# Patient Record
Sex: Female | Born: 1985 | Race: White | Hispanic: No | Marital: Single | Smoking: Current every day smoker
Health system: Southern US, Community
[De-identification: ages and names within clinical notes are randomized; demographics above are authoritative.]

## PROBLEM LIST (undated history)

## (undated) DIAGNOSIS — Z22322 Carrier or suspected carrier of Methicillin resistant Staphylococcus aureus: Secondary | ICD-10-CM

## (undated) DIAGNOSIS — N83209 Unspecified ovarian cyst, unspecified side: Secondary | ICD-10-CM

## (undated) HISTORY — PX: TUBAL LIGATION: SHX77

## (undated) HISTORY — PX: HAND SURGERY: SHX662

## (undated) HISTORY — PX: KNEE SURGERY: SHX244

## (undated) HISTORY — PX: DILATION AND CURETTAGE OF UTERUS: SHX78

## (undated) HISTORY — PX: CHOLECYSTECTOMY: SHX55

## (undated) HISTORY — PX: FOOT SURGERY: SHX648

---
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
## 2007-05-04 ENCOUNTER — Emergency Department: Payer: Self-pay | Admitting: Emergency Medicine

## 2007-05-04 ENCOUNTER — Other Ambulatory Visit: Payer: Self-pay

## 2007-06-16 ENCOUNTER — Emergency Department: Payer: Self-pay | Admitting: Emergency Medicine

## 2007-08-01 ENCOUNTER — Observation Stay: Payer: Self-pay

## 2007-08-05 ENCOUNTER — Inpatient Hospital Stay: Payer: Self-pay | Admitting: Certified Nurse Midwife

## 2007-09-12 ENCOUNTER — Observation Stay: Payer: Self-pay

## 2007-10-11 ENCOUNTER — Observation Stay: Payer: Self-pay | Admitting: Obstetrics and Gynecology

## 2007-10-19 ENCOUNTER — Observation Stay: Payer: Self-pay

## 2007-10-21 ENCOUNTER — Ambulatory Visit: Payer: Self-pay | Admitting: Certified Nurse Midwife

## 2007-10-25 ENCOUNTER — Observation Stay: Payer: Self-pay

## 2007-10-26 ENCOUNTER — Ambulatory Visit: Payer: Self-pay

## 2007-11-04 ENCOUNTER — Observation Stay: Payer: Self-pay

## 2007-11-06 ENCOUNTER — Observation Stay: Payer: Self-pay | Admitting: Obstetrics and Gynecology

## 2007-11-19 ENCOUNTER — Observation Stay: Payer: Self-pay

## 2007-11-27 ENCOUNTER — Observation Stay: Payer: Self-pay | Admitting: Obstetrics and Gynecology

## 2007-11-28 ENCOUNTER — Observation Stay: Payer: Self-pay | Admitting: Obstetrics and Gynecology

## 2007-12-01 ENCOUNTER — Observation Stay: Payer: Self-pay

## 2007-12-04 ENCOUNTER — Emergency Department: Payer: Self-pay | Admitting: Emergency Medicine

## 2007-12-08 ENCOUNTER — Observation Stay: Payer: Self-pay | Admitting: Obstetrics and Gynecology

## 2007-12-11 ENCOUNTER — Observation Stay: Payer: Self-pay

## 2007-12-13 ENCOUNTER — Inpatient Hospital Stay: Payer: Self-pay

## 2008-01-29 ENCOUNTER — Emergency Department: Payer: Self-pay | Admitting: Emergency Medicine

## 2008-03-17 ENCOUNTER — Ambulatory Visit: Payer: Self-pay | Admitting: Specialist

## 2008-04-13 ENCOUNTER — Ambulatory Visit: Payer: Self-pay | Admitting: Specialist

## 2008-04-27 ENCOUNTER — Ambulatory Visit: Payer: Self-pay | Admitting: Specialist

## 2008-11-10 ENCOUNTER — Ambulatory Visit: Payer: Self-pay | Admitting: Obstetrics and Gynecology

## 2008-11-15 ENCOUNTER — Ambulatory Visit: Payer: Self-pay | Admitting: Obstetrics and Gynecology

## 2008-11-16 ENCOUNTER — Observation Stay: Payer: Self-pay | Admitting: Obstetrics and Gynecology

## 2008-12-08 ENCOUNTER — Emergency Department: Payer: Self-pay | Admitting: Emergency Medicine

## 2008-12-12 ENCOUNTER — Inpatient Hospital Stay: Payer: Self-pay | Admitting: Obstetrics and Gynecology

## 2009-01-08 ENCOUNTER — Encounter: Payer: Self-pay | Admitting: Maternal & Fetal Medicine

## 2009-01-27 ENCOUNTER — Emergency Department: Payer: Self-pay | Admitting: Emergency Medicine

## 2009-02-08 ENCOUNTER — Encounter: Payer: Self-pay | Admitting: Maternal and Fetal Medicine

## 2009-02-22 ENCOUNTER — Encounter: Payer: Self-pay | Admitting: Obstetrics and Gynecology

## 2009-03-04 ENCOUNTER — Observation Stay: Payer: Self-pay | Admitting: Internal Medicine

## 2009-04-19 ENCOUNTER — Observation Stay: Payer: Self-pay | Admitting: Obstetrics and Gynecology

## 2009-05-08 ENCOUNTER — Observation Stay: Payer: Self-pay | Admitting: Obstetrics and Gynecology

## 2009-05-09 ENCOUNTER — Observation Stay: Payer: Self-pay | Admitting: Obstetrics and Gynecology

## 2009-05-19 ENCOUNTER — Observation Stay: Payer: Self-pay | Admitting: Obstetrics and Gynecology

## 2009-05-20 ENCOUNTER — Observation Stay: Payer: Self-pay | Admitting: Obstetrics and Gynecology

## 2009-06-07 ENCOUNTER — Observation Stay: Payer: Self-pay | Admitting: Obstetrics and Gynecology

## 2009-06-08 ENCOUNTER — Observation Stay: Payer: Self-pay

## 2009-06-16 ENCOUNTER — Observation Stay: Payer: Self-pay | Admitting: Obstetrics and Gynecology

## 2009-06-27 ENCOUNTER — Observation Stay: Payer: Self-pay | Admitting: Obstetrics and Gynecology

## 2009-07-05 ENCOUNTER — Observation Stay: Payer: Self-pay | Admitting: Obstetrics and Gynecology

## 2009-07-09 ENCOUNTER — Observation Stay: Payer: Self-pay

## 2009-07-10 ENCOUNTER — Observation Stay: Payer: Self-pay | Admitting: Obstetrics and Gynecology

## 2009-07-11 ENCOUNTER — Inpatient Hospital Stay: Payer: Self-pay

## 2009-10-28 ENCOUNTER — Emergency Department: Payer: Self-pay | Admitting: Emergency Medicine

## 2009-11-27 ENCOUNTER — Inpatient Hospital Stay: Payer: Self-pay | Admitting: Psychiatry

## 2009-12-19 ENCOUNTER — Ambulatory Visit: Payer: Self-pay | Admitting: Family

## 2010-01-01 ENCOUNTER — Ambulatory Visit: Payer: Self-pay | Admitting: Surgery

## 2010-01-03 ENCOUNTER — Inpatient Hospital Stay: Payer: Self-pay | Admitting: Vascular Surgery

## 2010-01-04 ENCOUNTER — Ambulatory Visit: Payer: Self-pay | Admitting: Surgery

## 2010-02-13 ENCOUNTER — Ambulatory Visit: Payer: Self-pay | Admitting: Family Medicine

## 2010-05-08 ENCOUNTER — Emergency Department: Payer: Self-pay | Admitting: Emergency Medicine

## 2010-05-09 ENCOUNTER — Emergency Department: Payer: Self-pay | Admitting: Emergency Medicine

## 2010-05-17 ENCOUNTER — Emergency Department: Payer: Self-pay | Admitting: Emergency Medicine

## 2010-05-24 ENCOUNTER — Ambulatory Visit: Payer: Self-pay | Admitting: Family Medicine

## 2010-06-10 ENCOUNTER — Ambulatory Visit: Payer: Self-pay | Admitting: Gastroenterology

## 2010-06-22 ENCOUNTER — Emergency Department: Payer: Self-pay | Admitting: Emergency Medicine

## 2010-08-14 ENCOUNTER — Ambulatory Visit: Payer: Self-pay | Admitting: Surgery

## 2010-08-21 ENCOUNTER — Emergency Department: Payer: Self-pay | Admitting: Internal Medicine

## 2010-09-16 ENCOUNTER — Ambulatory Visit: Payer: Self-pay | Admitting: Unknown Physician Specialty

## 2010-10-16 ENCOUNTER — Inpatient Hospital Stay: Payer: Self-pay | Admitting: Psychiatry

## 2010-11-10 ENCOUNTER — Emergency Department: Payer: Self-pay | Admitting: Emergency Medicine

## 2010-12-08 ENCOUNTER — Emergency Department: Payer: Self-pay | Admitting: Emergency Medicine

## 2011-04-21 ENCOUNTER — Ambulatory Visit: Payer: Self-pay | Admitting: Family Medicine

## 2011-04-24 ENCOUNTER — Observation Stay: Payer: Self-pay | Admitting: Surgery

## 2011-04-24 LAB — COMPREHENSIVE METABOLIC PANEL
Albumin: 3.9 g/dL (ref 3.4–5.0)
Anion Gap: 8 (ref 7–16)
BUN: 12 mg/dL (ref 7–18)
Bilirubin,Total: 0.3 mg/dL (ref 0.2–1.0)
Chloride: 108 mmol/L — ABNORMAL HIGH (ref 98–107)
Co2: 27 mmol/L (ref 21–32)
EGFR (African American): >60
Glucose: 76 mg/dL (ref 65–99)
Osmolality: 283 (ref 275–301)
Potassium: 4.4 mmol/L (ref 3.5–5.1)
SGOT(AST): 26 U/L (ref 15–37)
SGPT (ALT): 31 U/L
Sodium: 143 mmol/L (ref 136–145)

## 2011-04-24 LAB — URINALYSIS, COMPLETE
Bilirubin,UR: NEGATIVE
Leukocyte Esterase: NEGATIVE
Nitrite: NEGATIVE
Protein: NEGATIVE
RBC,UR: 3 /HPF (ref 0–5)
Specific Gravity: 1.025 (ref 1.003–1.030)
WBC UR: 2 /HPF (ref 0–5)

## 2011-04-24 LAB — LIPASE, BLOOD: Lipase: 112 U/L (ref 73–393)

## 2011-04-24 LAB — CBC
HGB: 15.5 g/dL (ref 12.0–16.0)
MCH: 30.3 pg (ref 26.0–34.0)
MCHC: 33.5 g/dL (ref 32.0–36.0)
MCV: 90 fL (ref 80–100)
WBC: 11.6 10*3/uL — ABNORMAL HIGH (ref 3.6–11.0)

## 2011-04-24 LAB — PREGNANCY, URINE: Pregnancy Test, Urine: NEGATIVE m[IU]/mL

## 2011-04-25 LAB — BASIC METABOLIC PANEL
Anion Gap: 12 (ref 7–16)
BUN: 10 mg/dL (ref 7–18)
Calcium, Total: 8.8 mg/dL (ref 8.5–10.1)
Chloride: 108 mmol/L — ABNORMAL HIGH (ref 98–107)
Co2: 21 mmol/L (ref 21–32)
Creatinine: 0.6 mg/dL (ref 0.60–1.30)
EGFR (African American): >60
EGFR (Non-African Amer.): >60
Glucose: 115 mg/dL — ABNORMAL HIGH (ref 65–99)
Osmolality: 281 (ref 275–301)
Potassium: 4 mmol/L (ref 3.5–5.1)
Sodium: 141 mmol/L (ref 136–145)

## 2011-04-25 LAB — CBC WITH DIFFERENTIAL/PLATELET
Basophil #: 0 10*3/uL (ref 0.0–0.1)
Basophil %: 0.1 %
Eosinophil #: 0 10*3/uL (ref 0.0–0.7)
Eosinophil %: 0 %
HCT: 41.9 % (ref 35.0–47.0)
MCHC: 33.9 g/dL (ref 32.0–36.0)
MCV: 90 fL (ref 80–100)
Monocyte #: 0.1 10*3/uL (ref 0.0–0.7)
Monocyte %: 1.2 %
Platelet: 212 10*3/uL (ref 150–440)
RBC: 4.68 10*6/uL (ref 3.80–5.20)
WBC: 6.7 10*3/uL (ref 3.6–11.0)

## 2011-04-26 LAB — CBC WITH DIFFERENTIAL/PLATELET
Basophil #: 0 10*3/uL (ref 0.0–0.1)
Eosinophil #: 0 10*3/uL (ref 0.0–0.7)
Eosinophil %: 0.5 %
HCT: 36.1 % (ref 35.0–47.0)
HGB: 12.2 g/dL (ref 12.0–16.0)
Lymphocyte #: 4 10*3/uL — ABNORMAL HIGH (ref 1.0–3.6)
Lymphocyte %: 49.8 %
Monocyte #: 0.5 10*3/uL (ref 0.0–0.7)
Monocyte %: 5.7 %
Neutrophil %: 43.6 %
Platelet: 191 10*3/uL (ref 150–440)
RBC: 3.97 10*6/uL (ref 3.80–5.20)
WBC: 7.9 10*3/uL (ref 3.6–11.0)

## 2011-06-16 ENCOUNTER — Emergency Department: Payer: Self-pay | Admitting: *Deleted

## 2011-06-16 LAB — URINALYSIS, COMPLETE
Glucose,UR: NEGATIVE mg/dL (ref 0–75)
Leukocyte Esterase: NEGATIVE
Nitrite: NEGATIVE
Ph: 5 (ref 4.5–8.0)
Protein: NEGATIVE
RBC,UR: 159 /HPF (ref 0–5)
Specific Gravity: 1.018 (ref 1.003–1.030)
Squamous Epithelial: <1

## 2011-06-16 LAB — COMPREHENSIVE METABOLIC PANEL
Anion Gap: 7 (ref 7–16)
Calcium, Total: 8.6 mg/dL (ref 8.5–10.1)
Chloride: 109 mmol/L — ABNORMAL HIGH (ref 98–107)
Creatinine: 0.69 mg/dL (ref 0.60–1.30)
EGFR (African American): >60
Glucose: 87 mg/dL (ref 65–99)
Osmolality: 279 (ref 275–301)
SGOT(AST): 17 U/L (ref 15–37)
SGPT (ALT): 21 U/L
Sodium: 141 mmol/L (ref 136–145)
Total Protein: 6.6 g/dL (ref 6.4–8.2)

## 2011-06-16 LAB — CBC
HGB: 13.9 g/dL (ref 12.0–16.0)
MCH: 31 pg (ref 26.0–34.0)
MCHC: 33.8 g/dL (ref 32.0–36.0)
MCV: 92 fL (ref 80–100)
RBC: 4.48 10*6/uL (ref 3.80–5.20)
WBC: 9.9 10*3/uL (ref 3.6–11.0)

## 2011-06-16 LAB — LIPASE, BLOOD: Lipase: 144 U/L (ref 73–393)

## 2011-12-11 ENCOUNTER — Emergency Department: Payer: Self-pay | Admitting: Emergency Medicine

## 2011-12-11 LAB — COMPREHENSIVE METABOLIC PANEL
Anion Gap: 4 — ABNORMAL LOW (ref 7–16)
BUN: 15 mg/dL (ref 7–18)
Bilirubin,Total: 0.4 mg/dL (ref 0.2–1.0)
Calcium, Total: 9.8 mg/dL (ref 8.5–10.1)
Chloride: 108 mmol/L — ABNORMAL HIGH (ref 98–107)
Co2: 28 mmol/L (ref 21–32)
EGFR (African American): >60
EGFR (Non-African Amer.): >60
Glucose: 84 mg/dL (ref 65–99)
Potassium: 4 mmol/L (ref 3.5–5.1)
SGOT(AST): 23 U/L (ref 15–37)
SGPT (ALT): 48 U/L (ref 12–78)
Sodium: 140 mmol/L (ref 136–145)
Total Protein: 7.9 g/dL (ref 6.4–8.2)

## 2011-12-11 LAB — CBC
HGB: 15.5 g/dL (ref 12.0–16.0)
Platelet: 237 10*3/uL (ref 150–440)
RBC: 5.02 10*6/uL (ref 3.80–5.20)
RDW: 13.2 % (ref 11.5–14.5)
WBC: 12.1 10*3/uL — ABNORMAL HIGH (ref 3.6–11.0)

## 2011-12-11 LAB — PREGNANCY, URINE: Pregnancy Test, Urine: NEGATIVE m[IU]/mL

## 2011-12-11 LAB — URINALYSIS, COMPLETE
Bilirubin,UR: NEGATIVE
Glucose,UR: NEGATIVE mg/dL (ref 0–75)
Ketone: NEGATIVE
Protein: 30
RBC,UR: 17 /HPF (ref 0–5)
Specific Gravity: 1.019 (ref 1.003–1.030)
Squamous Epithelial: 3

## 2011-12-13 LAB — URINE CULTURE

## 2012-01-23 ENCOUNTER — Emergency Department: Payer: Self-pay | Admitting: Emergency Medicine

## 2012-01-23 LAB — RAPID INFLUENZA A&B ANTIGENS

## 2012-01-24 ENCOUNTER — Emergency Department: Payer: Self-pay | Admitting: Emergency Medicine

## 2012-01-24 LAB — URINALYSIS, COMPLETE
Bilirubin,UR: NEGATIVE
Blood: NEGATIVE
Nitrite: NEGATIVE
Ph: 6 (ref 4.5–8.0)
Squamous Epithelial: 7

## 2012-01-24 LAB — COMPREHENSIVE METABOLIC PANEL
Albumin: 4.1 g/dL (ref 3.4–5.0)
Alkaline Phosphatase: 96 U/L (ref 50–136)
Anion Gap: 8 (ref 7–16)
BUN: 14 mg/dL (ref 7–18)
Calcium, Total: 9.8 mg/dL (ref 8.5–10.1)
Chloride: 107 mmol/L (ref 98–107)
Creatinine: 0.72 mg/dL (ref 0.60–1.30)
EGFR (African American): >60
EGFR (Non-African Amer.): >60
Glucose: 91 mg/dL (ref 65–99)
Potassium: 3.9 mmol/L (ref 3.5–5.1)
Sodium: 139 mmol/L (ref 136–145)
Total Protein: 8.1 g/dL (ref 6.4–8.2)

## 2012-01-24 LAB — CBC
MCH: 30.8 pg (ref 26.0–34.0)
MCHC: 34.4 g/dL (ref 32.0–36.0)
Platelet: 266 10*3/uL (ref 150–440)

## 2012-01-24 LAB — LIPASE, BLOOD: Lipase: 133 U/L (ref 73–393)

## 2012-01-26 ENCOUNTER — Emergency Department: Payer: Self-pay | Admitting: Emergency Medicine

## 2012-01-26 LAB — CBC WITH DIFFERENTIAL/PLATELET
Basophil #: 0 10*3/uL (ref 0.0–0.1)
Eosinophil #: 0.2 10*3/uL (ref 0.0–0.7)
HCT: 46.3 % (ref 35.0–47.0)
HGB: 15.6 g/dL (ref 12.0–16.0)
Lymphocyte %: 21 %
MCH: 30.6 pg (ref 26.0–34.0)
MCHC: 33.8 g/dL (ref 32.0–36.0)
Neutrophil #: 6.7 10*3/uL — ABNORMAL HIGH (ref 1.4–6.5)
Platelet: 250 10*3/uL (ref 150–440)
RBC: 5.11 10*6/uL (ref 3.80–5.20)
RDW: 13.6 % (ref 11.5–14.5)
WBC: 9.6 10*3/uL (ref 3.6–11.0)

## 2012-01-26 LAB — URINALYSIS, COMPLETE
Bilirubin,UR: NEGATIVE
Ketone: NEGATIVE
Nitrite: NEGATIVE
Ph: 6 (ref 4.5–8.0)
RBC,UR: 1 /HPF (ref 0–5)
Specific Gravity: 1.02 (ref 1.003–1.030)
Squamous Epithelial: 10

## 2012-01-26 LAB — COMPREHENSIVE METABOLIC PANEL
Albumin: 4.4 g/dL (ref 3.4–5.0)
Anion Gap: 8 (ref 7–16)
Calcium, Total: 9.4 mg/dL (ref 8.5–10.1)
Chloride: 109 mmol/L — ABNORMAL HIGH (ref 98–107)
EGFR (Non-African Amer.): >60
Glucose: 86 mg/dL (ref 65–99)
Osmolality: 279 (ref 275–301)
Potassium: 4.1 mmol/L (ref 3.5–5.1)
Sodium: 140 mmol/L (ref 136–145)
Total Protein: 8.1 g/dL (ref 6.4–8.2)

## 2012-01-26 LAB — HCG, QUANTITATIVE, PREGNANCY: Beta Hcg, Quant.: <1 m[IU]/mL — ABNORMAL LOW

## 2012-03-29 ENCOUNTER — Emergency Department: Payer: Self-pay | Admitting: Emergency Medicine

## 2012-03-29 LAB — COMPREHENSIVE METABOLIC PANEL
Albumin: 4 g/dL (ref 3.4–5.0)
Alkaline Phosphatase: 87 U/L (ref 50–136)
Anion Gap: 6 — ABNORMAL LOW (ref 7–16)
BUN: 11 mg/dL (ref 7–18)
Bilirubin,Total: 0.3 mg/dL (ref 0.2–1.0)
Calcium, Total: 9.5 mg/dL (ref 8.5–10.1)
Chloride: 105 mmol/L (ref 98–107)
EGFR (African American): >60
EGFR (Non-African Amer.): >60
Osmolality: 271 (ref 275–301)
Potassium: 3.8 mmol/L (ref 3.5–5.1)
SGOT(AST): 18 U/L (ref 15–37)
SGPT (ALT): 24 U/L (ref 12–78)
Total Protein: 8.4 g/dL — ABNORMAL HIGH (ref 6.4–8.2)

## 2012-03-29 LAB — URINALYSIS, COMPLETE
Bilirubin,UR: NEGATIVE
Blood: NEGATIVE
Glucose,UR: NEGATIVE mg/dL (ref 0–75)
Ketone: NEGATIVE
Nitrite: NEGATIVE
Ph: 5 (ref 4.5–8.0)
RBC,UR: <1 /HPF (ref 0–5)
Specific Gravity: 1.018 (ref 1.003–1.030)
WBC UR: <1 /HPF (ref 0–5)

## 2012-03-29 LAB — HCG, QUANTITATIVE, PREGNANCY: Beta Hcg, Quant.: 3587 m[IU]/mL — ABNORMAL HIGH

## 2012-03-29 LAB — CBC
HGB: 15.1 g/dL (ref 12.0–16.0)
MCHC: 33.2 g/dL (ref 32.0–36.0)
MCV: 90 fL (ref 80–100)
RDW: 13.5 % (ref 11.5–14.5)

## 2012-03-29 LAB — WET PREP, GENITAL

## 2012-04-11 ENCOUNTER — Emergency Department: Payer: Self-pay | Admitting: Unknown Physician Specialty

## 2012-04-11 LAB — HEPATIC FUNCTION PANEL A (ARMC)
Albumin: 3.9 g/dL (ref 3.4–5.0)
Alkaline Phosphatase: 86 U/L (ref 50–136)
Bilirubin, Direct: 0.1 mg/dL (ref 0.00–0.20)
Bilirubin,Total: 0.5 mg/dL (ref 0.2–1.0)

## 2012-04-11 LAB — URINALYSIS, COMPLETE
Bacteria: NONE SEEN
Blood: NEGATIVE
Ketone: NEGATIVE
Leukocyte Esterase: NEGATIVE
Nitrite: NEGATIVE
Ph: 5 (ref 4.5–8.0)
Protein: 30
RBC,UR: 1 /HPF (ref 0–5)
Squamous Epithelial: 29

## 2012-04-11 LAB — LIPASE, BLOOD: Lipase: 109 U/L (ref 73–393)

## 2012-04-11 LAB — MAGNESIUM: Magnesium: 1.6 mg/dL — ABNORMAL LOW

## 2012-04-11 LAB — CBC
HCT: 44 % (ref 35.0–47.0)
HGB: 15.4 g/dL (ref 12.0–16.0)
Platelet: 202 10*3/uL (ref 150–440)
RDW: 13.1 % (ref 11.5–14.5)

## 2012-04-11 LAB — BASIC METABOLIC PANEL
Calcium, Total: 8.7 mg/dL (ref 8.5–10.1)
Chloride: 105 mmol/L (ref 98–107)
Co2: 19 mmol/L — ABNORMAL LOW (ref 21–32)
EGFR (African American): >60
EGFR (Non-African Amer.): >60
Glucose: 111 mg/dL — ABNORMAL HIGH (ref 65–99)
Osmolality: 273 (ref 275–301)
Potassium: 3.4 mmol/L — ABNORMAL LOW (ref 3.5–5.1)

## 2012-04-11 LAB — HCG, QUANTITATIVE, PREGNANCY: Beta Hcg, Quant.: 43486 m[IU]/mL — ABNORMAL HIGH

## 2012-06-05 ENCOUNTER — Emergency Department: Payer: Self-pay | Admitting: Emergency Medicine

## 2012-06-05 LAB — URINALYSIS, COMPLETE
Bacteria: NONE SEEN
Glucose,UR: NEGATIVE mg/dL (ref 0–75)
Nitrite: NEGATIVE
Ph: 6 (ref 4.5–8.0)
Specific Gravity: 1.03 (ref 1.003–1.030)
WBC UR: 11 /HPF (ref 0–5)

## 2012-06-05 LAB — CBC
MCH: 29.4 pg (ref 26.0–34.0)
MCHC: 34.4 g/dL (ref 32.0–36.0)
MCV: 86 fL (ref 80–100)
Platelet: 194 10*3/uL (ref 150–440)
RBC: 4.18 10*6/uL (ref 3.80–5.20)
RDW: 13.5 % (ref 11.5–14.5)

## 2012-06-05 LAB — COMPREHENSIVE METABOLIC PANEL
Alkaline Phosphatase: 79 U/L (ref 50–136)
Anion Gap: 7 (ref 7–16)
Bilirubin,Total: 0.1 mg/dL — ABNORMAL LOW (ref 0.2–1.0)
Creatinine: 0.46 mg/dL — ABNORMAL LOW (ref 0.60–1.30)
EGFR (Non-African Amer.): >60
Potassium: 3.4 mmol/L — ABNORMAL LOW (ref 3.5–5.1)
SGOT(AST): 14 U/L — ABNORMAL LOW (ref 15–37)
SGPT (ALT): 20 U/L (ref 12–78)

## 2012-06-05 LAB — WET PREP, GENITAL

## 2012-06-05 LAB — GC/CHLAMYDIA PROBE AMP

## 2012-06-10 ENCOUNTER — Ambulatory Visit: Payer: Self-pay | Admitting: Obstetrics & Gynecology

## 2012-07-29 ENCOUNTER — Observation Stay: Payer: Self-pay | Admitting: Obstetrics & Gynecology

## 2012-07-29 LAB — URINALYSIS, COMPLETE
Bilirubin,UR: NEGATIVE
Leukocyte Esterase: NEGATIVE
Nitrite: NEGATIVE
Ph: 6 (ref 4.5–8.0)
Protein: NEGATIVE
RBC,UR: 20 /HPF (ref 0–5)
Squamous Epithelial: 12
WBC UR: 4 /HPF (ref 0–5)

## 2012-08-14 ENCOUNTER — Observation Stay: Payer: Self-pay | Admitting: Obstetrics and Gynecology

## 2012-08-14 LAB — URINALYSIS, COMPLETE
Glucose,UR: NEGATIVE mg/dL (ref 0–75)
Nitrite: NEGATIVE
Ph: 6 (ref 4.5–8.0)
RBC,UR: 8 /HPF (ref 0–5)
Specific Gravity: 1.013 (ref 1.003–1.030)
Squamous Epithelial: 6
WBC UR: 3 /HPF (ref 0–5)

## 2012-08-16 LAB — URINE CULTURE

## 2012-09-06 ENCOUNTER — Observation Stay: Payer: Self-pay | Admitting: Obstetrics & Gynecology

## 2012-09-09 ENCOUNTER — Observation Stay: Payer: Self-pay | Admitting: Obstetrics and Gynecology

## 2012-09-09 ENCOUNTER — Ambulatory Visit (HOSPITAL_COMMUNITY)
Admission: AD | Admit: 2012-09-09 | Discharge: 2012-09-09 | Disposition: A | Discharge status: Home / Self Care | Payer: Medicaid Other | Source: Other Acute Inpatient Hospital | Attending: Obstetrics and Gynecology | Admitting: Obstetrics and Gynecology

## 2012-09-09 DIAGNOSIS — Z331 Pregnant state, incidental: Secondary | ICD-10-CM | POA: Insufficient documentation

## 2012-09-09 DIAGNOSIS — O479 False labor, unspecified: Secondary | ICD-10-CM | POA: Insufficient documentation

## 2012-09-09 LAB — URINALYSIS, COMPLETE
Bacteria: NONE SEEN
Bilirubin,UR: NEGATIVE
Blood: NEGATIVE
Protein: 30
Specific Gravity: 1.016 (ref 1.003–1.030)
Squamous Epithelial: 1
WBC UR: 32 /HPF (ref 0–5)

## 2012-09-09 LAB — CBC WITH DIFFERENTIAL/PLATELET
Basophil #: 0 10*3/uL (ref 0.0–0.1)
Eosinophil #: 0 10*3/uL (ref 0.0–0.7)
Eosinophil %: 0 %
HCT: 37.1 % (ref 35.0–47.0)
HGB: 12.8 g/dL (ref 12.0–16.0)
Lymphocyte #: 1.2 10*3/uL (ref 1.0–3.6)
MCH: 30.2 pg (ref 26.0–34.0)
MCV: 88 fL (ref 80–100)
Monocyte #: 0.6 x10 3/mm (ref 0.2–0.9)
Neutrophil #: 22 10*3/uL — ABNORMAL HIGH (ref 1.4–6.5)
Neutrophil %: 92.6 %
RBC: 4.23 10*6/uL (ref 3.80–5.20)
WBC: 23.8 10*3/uL — ABNORMAL HIGH (ref 3.6–11.0)

## 2012-09-17 DIAGNOSIS — Z9889 Other specified postprocedural states: Secondary | ICD-10-CM | POA: Insufficient documentation

## 2012-09-17 DIAGNOSIS — Z98891 History of uterine scar from previous surgery: Secondary | ICD-10-CM | POA: Insufficient documentation

## 2012-11-24 ENCOUNTER — Ambulatory Visit: Payer: Self-pay | Admitting: Obstetrics and Gynecology

## 2012-11-30 ENCOUNTER — Ambulatory Visit: Payer: Self-pay | Admitting: Obstetrics and Gynecology

## 2013-07-21 ENCOUNTER — Emergency Department: Payer: Self-pay | Admitting: Emergency Medicine

## 2013-07-21 LAB — CBC WITH DIFFERENTIAL/PLATELET
BASOS PCT: 0.4 %
Basophil #: 0 10*3/uL (ref 0.0–0.1)
EOS ABS: 0.2 10*3/uL (ref 0.0–0.7)
EOS PCT: 1.6 %
HCT: 41.1 % (ref 35.0–47.0)
HGB: 13.8 g/dL (ref 12.0–16.0)
LYMPHS ABS: 3.4 10*3/uL (ref 1.0–3.6)
LYMPHS PCT: 33.2 %
MCH: 29.4 pg (ref 26.0–34.0)
MCHC: 33.5 g/dL (ref 32.0–36.0)
MCV: 88 fL (ref 80–100)
MONO ABS: 0.6 x10 3/mm (ref 0.2–0.9)
MONOS PCT: 6 %
NEUTROS ABS: 6 10*3/uL (ref 1.4–6.5)
Neutrophil %: 58.8 %
Platelet: 293 10*3/uL (ref 150–440)
RBC: 4.69 10*6/uL (ref 3.80–5.20)
RDW: 13.2 % (ref 11.5–14.5)
WBC: 10.1 10*3/uL (ref 3.6–11.0)

## 2013-07-21 LAB — COMPREHENSIVE METABOLIC PANEL
AST: 34 U/L (ref 15–37)
Albumin: 3.7 g/dL (ref 3.4–5.0)
Alkaline Phosphatase: 100 U/L
Anion Gap: 9 (ref 7–16)
BUN: 9 mg/dL (ref 7–18)
Bilirubin,Total: 0.3 mg/dL (ref 0.2–1.0)
CHLORIDE: 105 mmol/L (ref 98–107)
CO2: 24 mmol/L (ref 21–32)
CREATININE: 0.61 mg/dL (ref 0.60–1.30)
Calcium, Total: 9.4 mg/dL (ref 8.5–10.1)
EGFR (African American): >60
EGFR (Non-African Amer.): >60
GLUCOSE: 86 mg/dL (ref 65–99)
Osmolality: 274 (ref 275–301)
Potassium: 3.6 mmol/L (ref 3.5–5.1)
SGPT (ALT): 57 U/L (ref 12–78)
Sodium: 138 mmol/L (ref 136–145)
TOTAL PROTEIN: 7.2 g/dL (ref 6.4–8.2)

## 2013-07-21 LAB — URINALYSIS, COMPLETE
BILIRUBIN, UR: NEGATIVE
BLOOD: NEGATIVE
GLUCOSE, UR: NEGATIVE mg/dL (ref 0–75)
KETONE: NEGATIVE
Nitrite: NEGATIVE
Ph: 5 (ref 4.5–8.0)
Protein: NEGATIVE
Specific Gravity: 1.023 (ref 1.003–1.030)
WBC UR: 9 /HPF (ref 0–5)

## 2013-07-21 LAB — LIPASE, BLOOD: LIPASE: 125 U/L (ref 73–393)

## 2013-08-05 ENCOUNTER — Emergency Department: Payer: Self-pay | Admitting: Emergency Medicine

## 2013-08-05 LAB — CBC
HCT: 41 % (ref 35.0–47.0)
HGB: 13.5 g/dL (ref 12.0–16.0)
MCH: 28.7 pg (ref 26.0–34.0)
MCHC: 32.9 g/dL (ref 32.0–36.0)
MCV: 87 fL (ref 80–100)
Platelet: 251 10*3/uL (ref 150–440)
RBC: 4.7 10*6/uL (ref 3.80–5.20)
RDW: 13.5 % (ref 11.5–14.5)
WBC: 9.9 10*3/uL (ref 3.6–11.0)

## 2013-08-05 LAB — WET PREP, GENITAL

## 2013-09-21 ENCOUNTER — Emergency Department: Payer: Self-pay | Admitting: Student

## 2013-09-21 LAB — URINALYSIS, COMPLETE
BILIRUBIN, UR: NEGATIVE
BLOOD: NEGATIVE
Bacteria: NONE SEEN
GLUCOSE, UR: NEGATIVE mg/dL (ref 0–75)
KETONE: NEGATIVE
LEUKOCYTE ESTERASE: NEGATIVE
NITRITE: NEGATIVE
Ph: 5 (ref 4.5–8.0)
Protein: NEGATIVE
RBC,UR: 1 /HPF (ref 0–5)
Specific Gravity: 1.02 (ref 1.003–1.030)

## 2013-09-21 LAB — COMPREHENSIVE METABOLIC PANEL
ALBUMIN: 3.6 g/dL (ref 3.4–5.0)
ALK PHOS: 103 U/L
ALT: 22 U/L
Anion Gap: 9 (ref 7–16)
BUN: 11 mg/dL (ref 7–18)
Bilirubin,Total: 0.2 mg/dL (ref 0.2–1.0)
CHLORIDE: 108 mmol/L — AB (ref 98–107)
CREATININE: 0.87 mg/dL (ref 0.60–1.30)
Calcium, Total: 8.8 mg/dL (ref 8.5–10.1)
Co2: 23 mmol/L (ref 21–32)
EGFR (African American): >60
EGFR (Non-African Amer.): >60
Glucose: 91 mg/dL (ref 65–99)
OSMOLALITY: 278 (ref 275–301)
POTASSIUM: 4 mmol/L (ref 3.5–5.1)
SGOT(AST): 16 U/L (ref 15–37)
Sodium: 140 mmol/L (ref 136–145)
Total Protein: 7.4 g/dL (ref 6.4–8.2)

## 2013-09-21 LAB — CBC WITH DIFFERENTIAL/PLATELET
BASOS PCT: 0.6 %
Basophil #: 0.1 10*3/uL (ref 0.0–0.1)
Eosinophil #: 0.1 10*3/uL (ref 0.0–0.7)
Eosinophil %: 1.4 %
HCT: 42.5 % (ref 35.0–47.0)
HGB: 13.8 g/dL (ref 12.0–16.0)
Lymphocyte #: 2.8 10*3/uL (ref 1.0–3.6)
Lymphocyte %: 30 %
MCH: 28.3 pg (ref 26.0–34.0)
MCHC: 32.5 g/dL (ref 32.0–36.0)
MCV: 87 fL (ref 80–100)
Monocyte #: 0.6 x10 3/mm (ref 0.2–0.9)
Monocyte %: 6.2 %
NEUTROS ABS: 5.8 10*3/uL (ref 1.4–6.5)
Neutrophil %: 61.8 %
PLATELETS: 283 10*3/uL (ref 150–440)
RBC: 4.87 10*6/uL (ref 3.80–5.20)
RDW: 13.9 % (ref 11.5–14.5)
WBC: 9.4 10*3/uL (ref 3.6–11.0)

## 2013-09-21 LAB — GC/CHLAMYDIA PROBE AMP

## 2013-09-21 LAB — WET PREP, GENITAL

## 2013-09-21 LAB — HCG, QUANTITATIVE, PREGNANCY

## 2013-09-21 LAB — LIPASE, BLOOD: Lipase: 144 U/L (ref 73–393)

## 2013-12-03 ENCOUNTER — Emergency Department: Payer: Self-pay | Admitting: Emergency Medicine

## 2013-12-03 LAB — COMPREHENSIVE METABOLIC PANEL
ALK PHOS: 105 U/L
AST: 25 U/L (ref 15–37)
Albumin: 3.8 g/dL (ref 3.4–5.0)
Anion Gap: 6 — ABNORMAL LOW (ref 7–16)
BUN: 10 mg/dL (ref 7–18)
Bilirubin,Total: 0.2 mg/dL (ref 0.2–1.0)
CALCIUM: 9.6 mg/dL (ref 8.5–10.1)
CHLORIDE: 108 mmol/L — AB (ref 98–107)
CREATININE: 0.79 mg/dL (ref 0.60–1.30)
Co2: 28 mmol/L (ref 21–32)
EGFR (African American): >60
Glucose: 83 mg/dL (ref 65–99)
Osmolality: 281 (ref 275–301)
POTASSIUM: 3.6 mmol/L (ref 3.5–5.1)
SGPT (ALT): 36 U/L
SODIUM: 142 mmol/L (ref 136–145)
Total Protein: 7.6 g/dL (ref 6.4–8.2)

## 2013-12-03 LAB — CBC WITH DIFFERENTIAL/PLATELET
Basophil #: 0.1 10*3/uL (ref 0.0–0.1)
Basophil %: 0.9 %
Eosinophil #: 0.2 10*3/uL (ref 0.0–0.7)
Eosinophil %: 1.6 %
HCT: 42.3 % (ref 35.0–47.0)
HGB: 14.1 g/dL (ref 12.0–16.0)
Lymphocyte #: 3.4 10*3/uL (ref 1.0–3.6)
Lymphocyte %: 33.2 %
MCH: 28.7 pg (ref 26.0–34.0)
MCHC: 33.3 g/dL (ref 32.0–36.0)
MCV: 86 fL (ref 80–100)
MONO ABS: 0.7 x10 3/mm (ref 0.2–0.9)
MONOS PCT: 6.7 %
Neutrophil #: 5.9 10*3/uL (ref 1.4–6.5)
Neutrophil %: 57.6 %
Platelet: 271 10*3/uL (ref 150–440)
RBC: 4.89 10*6/uL (ref 3.80–5.20)
RDW: 14.1 % (ref 11.5–14.5)
WBC: 10.3 10*3/uL (ref 3.6–11.0)

## 2013-12-03 LAB — URINALYSIS, COMPLETE
BLOOD: NEGATIVE
Bacteria: NONE SEEN
Bilirubin,UR: NEGATIVE
Glucose,UR: NEGATIVE mg/dL (ref 0–75)
Ketone: NEGATIVE
Nitrite: NEGATIVE
PH: 5 (ref 4.5–8.0)
Protein: NEGATIVE
RBC,UR: 5 /HPF (ref 0–5)
SPECIFIC GRAVITY: 1.018 (ref 1.003–1.030)
Squamous Epithelial: 11
WBC UR: 6 /HPF (ref 0–5)

## 2013-12-03 LAB — PREGNANCY, URINE: Pregnancy Test, Urine: NEGATIVE m[IU]/mL

## 2013-12-03 LAB — WET PREP, GENITAL

## 2013-12-03 LAB — GC/CHLAMYDIA PROBE AMP

## 2013-12-03 LAB — LIPASE, BLOOD: Lipase: 111 U/L (ref 73–393)

## 2013-12-27 IMAGING — CT CT ABD-PELV W/ CM
1 of 2 series · 15 of 32 positions shown, 19 images · IV contrast (isovue)
Comparison: None

REASON FOR EXAM: (1) abd pain - RLQ -; (2) abd pain - RLQ -
COMMENTS:

PROCEDURE:     CT  - CT ABDOMEN / PELVIS  W  - April 24, 2011  [DATE]
RESULT:     History: Right lower quadrant pain
TECHNIQUE: Multiple axial images of the abdomen and pelvis were performed
from the lung bases to the pubic symphysis, with p.o. contrast and with 100
ml of Isovue 300 intravenous contrast.

[Series 2: soft tissue · axial · 0.78mm/px · z∈[-551,-83]mm · 15 of 172 slices shown, 19 images]
[im 8/172  soft-tissue]
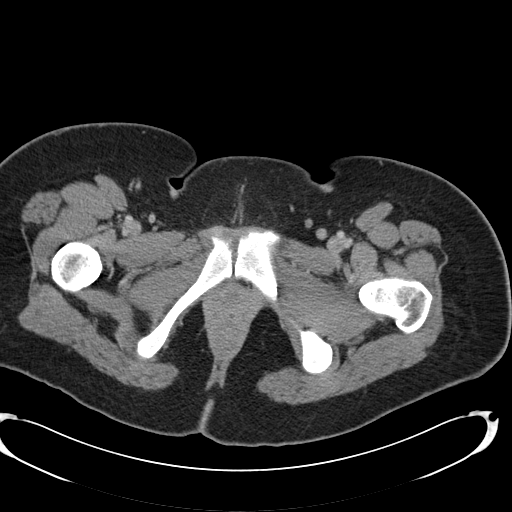
[im 8/172  bone]
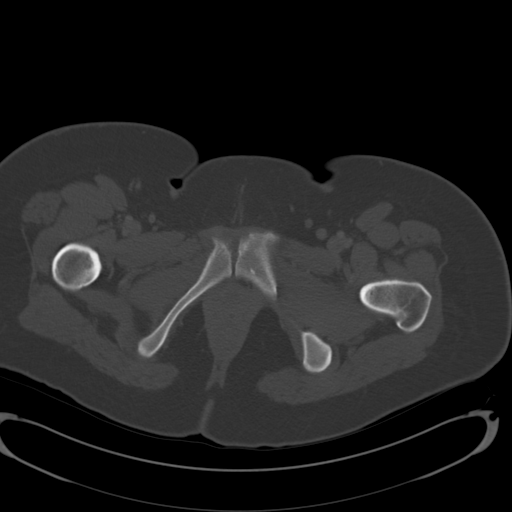
[im 22/172  soft-tissue]
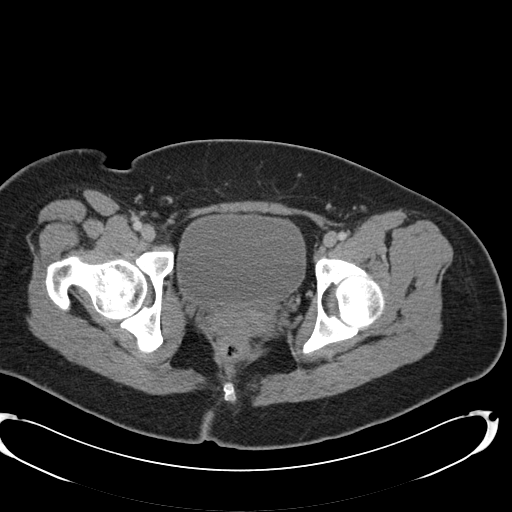
[im 36/172  soft-tissue]
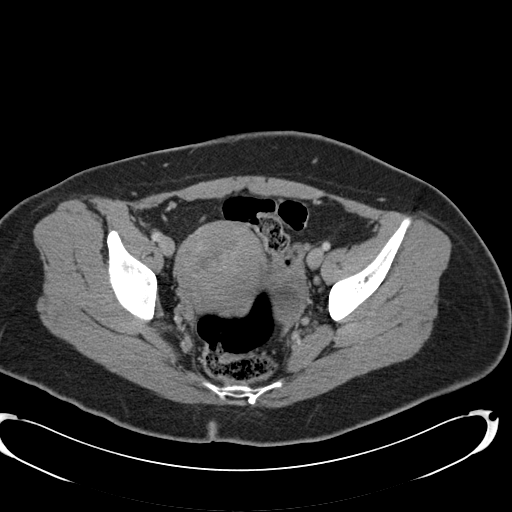
[im 50/172  soft-tissue]
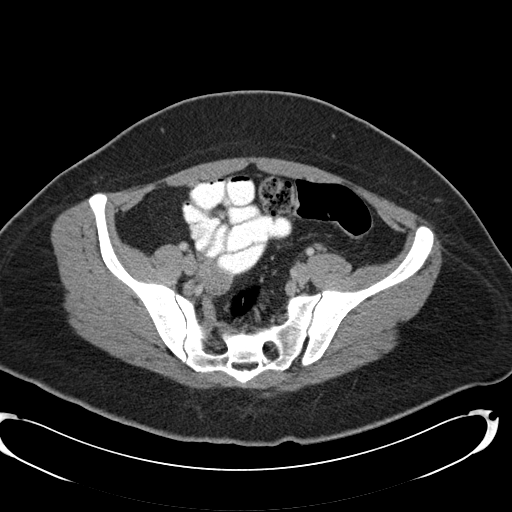
[im 58/172  soft-tissue]
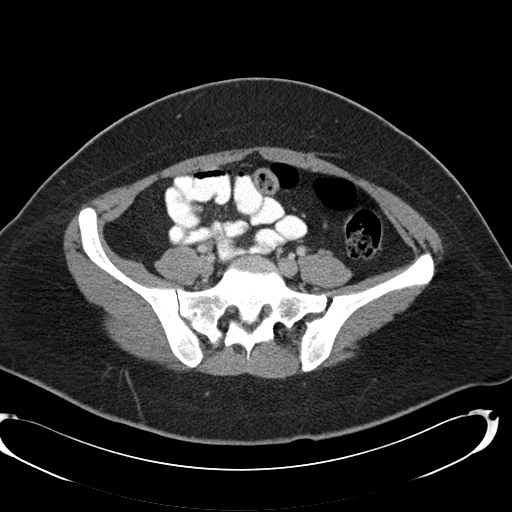
[im 72/172  soft-tissue]
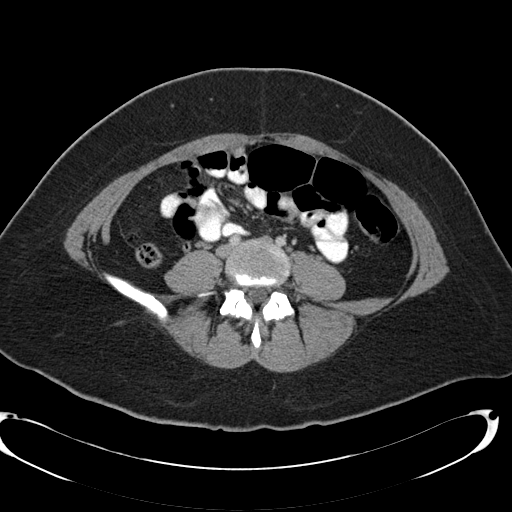
[im 86/172  soft-tissue]
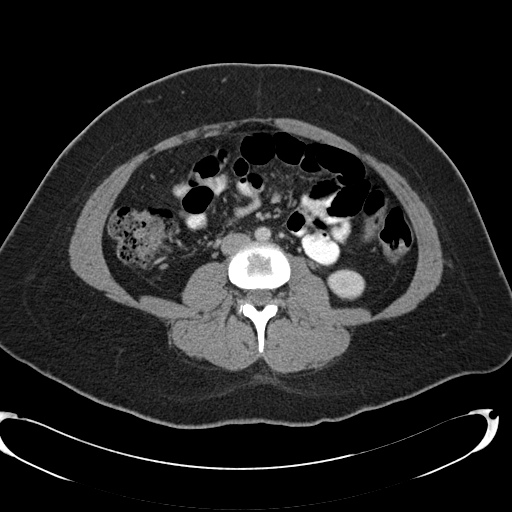
[im 100/172  soft-tissue]
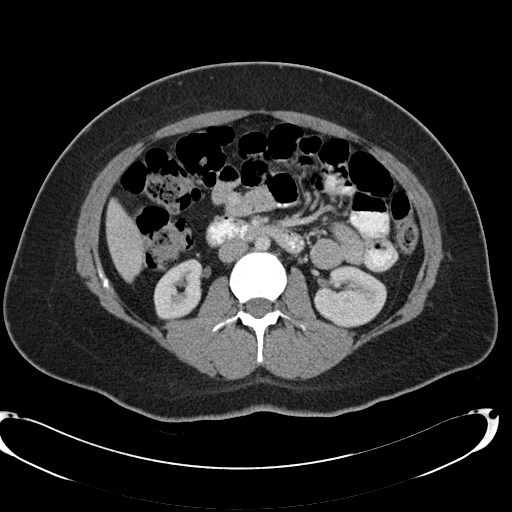
[im 115/172  soft-tissue]
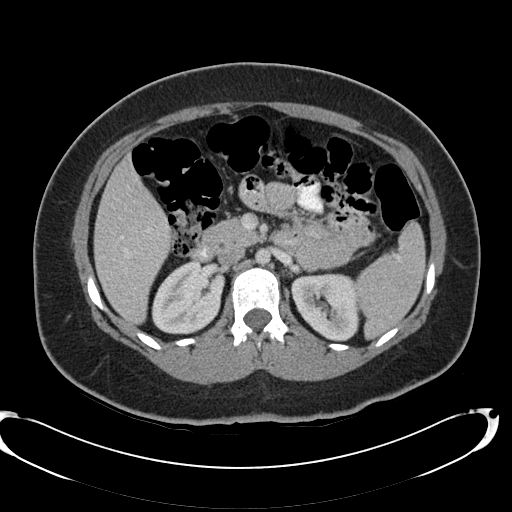
[im 115/172  bone]
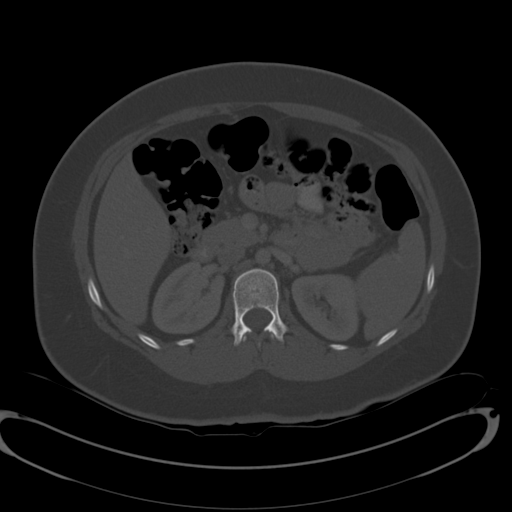
[im 122/172  soft-tissue]
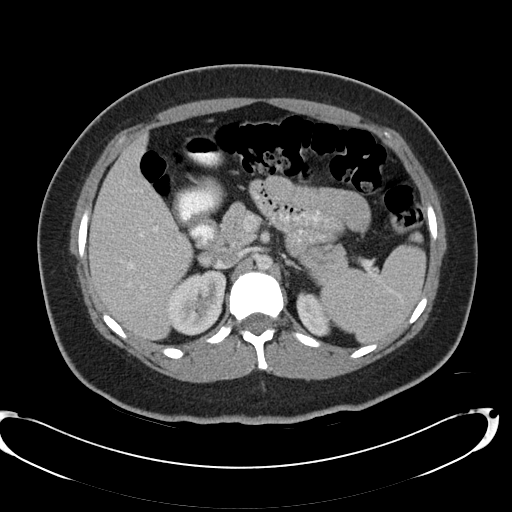
[im 136/172  soft-tissue]
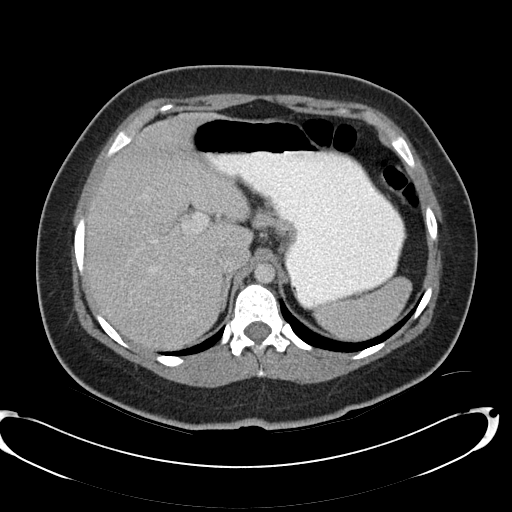
[im 143/172  lung]
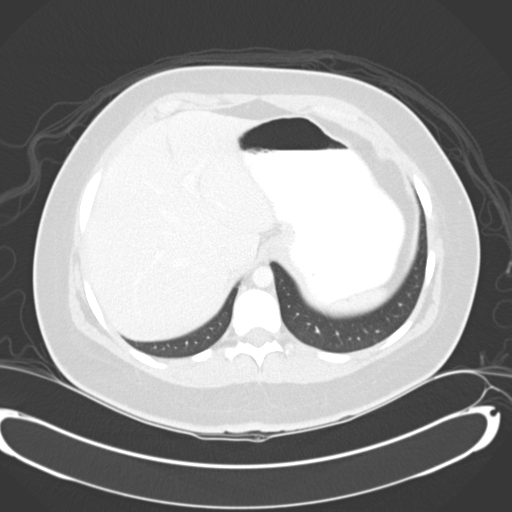
[im 150/172  soft-tissue]
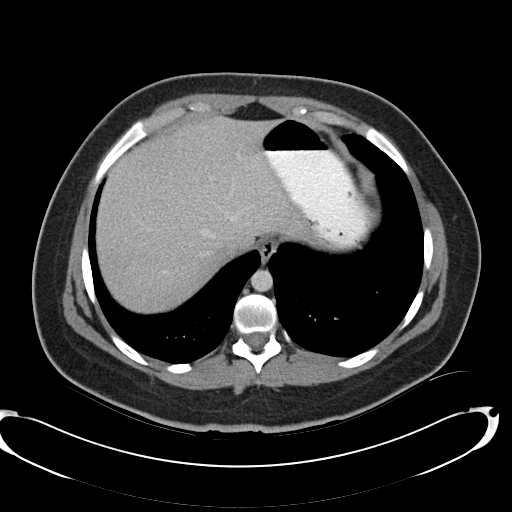
[im 150/172  lung]
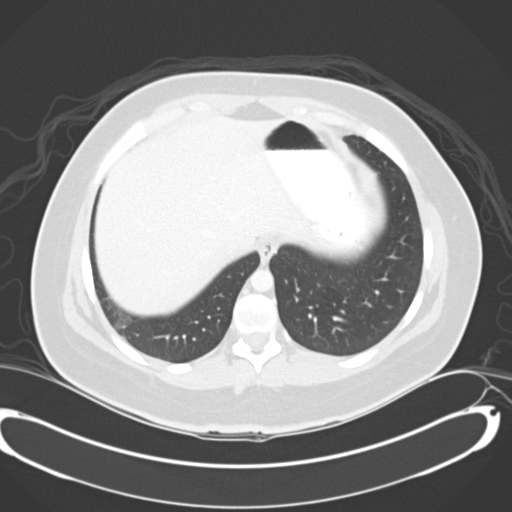
[im 157/172  lung]
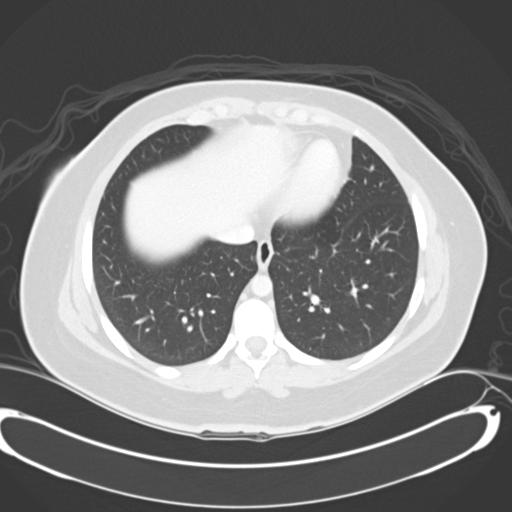
[im 164/172  soft-tissue]
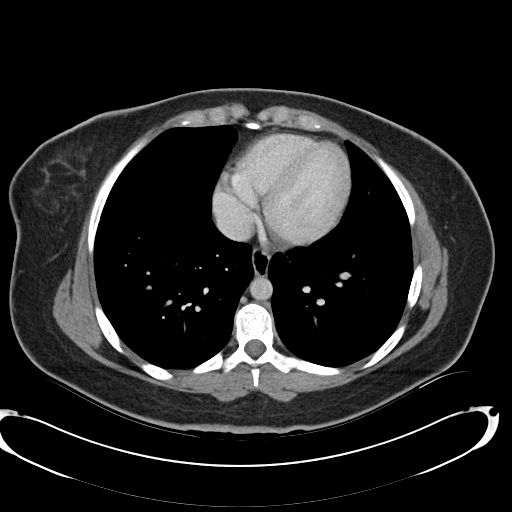
[im 164/172  lung]
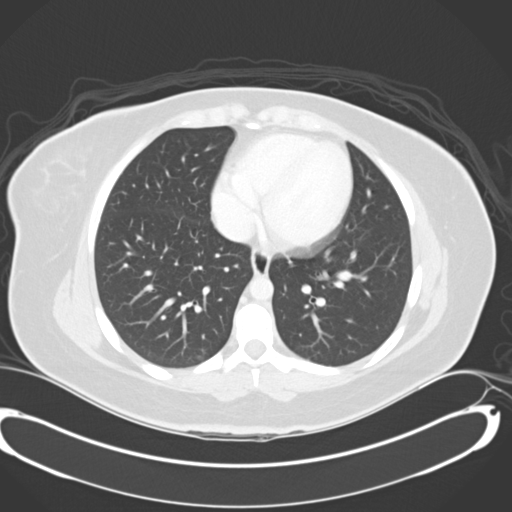

[15 of 32 positions shown; findings below may reference images not displayed]

FINDINGS: The lung bases are clear. There is no pneumothorax. The heart size is
normal.

The liver demonstrates no focal abnormality. There is no intrahepatic or
extrahepatic biliary ductal dilatation. The gallbladder is surgically
absent. The spleen demonstrates no focal abnormality. The kidneys, adrenal
glands, and pancreas are normal. The bladder is unremarkable.

The stomach, duodenum, small intestine, and large intestine demonstrate no
contrast extravasation or dilatation. There is a normal caliber appendix in
the right lower quadrant without periappendiceal inflammatory changes. There
is no pneumoperitoneum, pneumatosis, or portal venous gas. There is no
abdominal or pelvic free fluid. There is no lymphadenopathy.

There is a left ovarian cyst noted.

The abdominal aorta is normal in caliber.

The osseous structures are unremarkable.
IMPRESSION: 1. Normal appendix.

2. Left ovarian cyst.

## 2014-02-08 ENCOUNTER — Inpatient Hospital Stay: Payer: Self-pay | Admitting: Psychiatry

## 2014-02-08 LAB — SALICYLATE LEVEL

## 2014-02-08 LAB — CBC
HCT: 43.4 % (ref 35.0–47.0)
HGB: 14.2 g/dL (ref 12.0–16.0)
MCH: 28.5 pg (ref 26.0–34.0)
MCHC: 32.8 g/dL (ref 32.0–36.0)
MCV: 87 fL (ref 80–100)
PLATELETS: 253 10*3/uL (ref 150–440)
RBC: 5 10*6/uL (ref 3.80–5.20)
RDW: 13.7 % (ref 11.5–14.5)
WBC: 12.1 10*3/uL — ABNORMAL HIGH (ref 3.6–11.0)

## 2014-02-08 LAB — DRUG SCREEN, URINE

## 2014-02-08 LAB — COMPREHENSIVE METABOLIC PANEL
ANION GAP: 9 (ref 7–16)
Albumin: 3.8 g/dL (ref 3.4–5.0)
Alkaline Phosphatase: 111 U/L
BILIRUBIN TOTAL: 0.3 mg/dL (ref 0.2–1.0)
BUN: 10 mg/dL (ref 7–18)
CHLORIDE: 104 mmol/L (ref 98–107)
CREATININE: 0.78 mg/dL (ref 0.60–1.30)
Calcium, Total: 8.9 mg/dL (ref 8.5–10.1)
Co2: 26 mmol/L (ref 21–32)
Glucose: 97 mg/dL (ref 65–99)
OSMOLALITY: 277 (ref 275–301)
Potassium: 3.8 mmol/L (ref 3.5–5.1)
SGOT(AST): 31 U/L (ref 15–37)
SGPT (ALT): 40 U/L
SODIUM: 139 mmol/L (ref 136–145)
TOTAL PROTEIN: 7.3 g/dL (ref 6.4–8.2)

## 2014-02-08 LAB — URINALYSIS, COMPLETE
Bacteria: NONE SEEN
Bilirubin,UR: NEGATIVE
Blood: NEGATIVE
GLUCOSE, UR: NEGATIVE mg/dL (ref 0–75)
Ketone: NEGATIVE
Leukocyte Esterase: NEGATIVE
Nitrite: NEGATIVE
PH: 5 (ref 4.5–8.0)
PROTEIN: NEGATIVE
RBC,UR: 3 /HPF (ref 0–5)
SPECIFIC GRAVITY: 1.02 (ref 1.003–1.030)
Squamous Epithelial: 12

## 2014-02-08 LAB — ETHANOL: Ethanol: <3 mg/dL

## 2014-02-08 LAB — ACETAMINOPHEN LEVEL: Acetaminophen: <2 ug/mL

## 2014-02-08 LAB — TSH: Thyroid Stimulating Horm: 1.77 u[IU]/mL

## 2014-05-19 NOTE — Op Note (Signed)
PATIENT NAME:  Holly Berry, Holly Berry MR#:  [id number] DATE OF BIRTH:  Dec 21, 1985  DATE OF PROCEDURE:  11/30/2012  PREOPERATIVE DIAGNOSES:  Patient desires permanent surgical sterilization.   POSTOPERATIVE DIAGNOSIS:  Patient desires permanent surgical sterilization.   OPERATION PERFORMED: Laparoscopic bilateral tubal ligation via Falope-Rings.  ANESTHESIA USED: General.   PRIMARY SURGEON: Florina OuAndreas M. Bonney AidStaebler, MD   PREOPERATIVE ANTIBIOTICS: None.   DRAINS OR TUBES: None.   IMPLANTS: None.   ESTIMATED BLOOD LOSS:  Minimal.   COMPLICATIONS: None.   FINDINGS: Normal tubes, ovaries, and uterus. Remainder of visualized intra-abdominal anatomy was normal. After application of the Falope-Rings, a good 1 cm knuckle of blanching tube was noted within each ring.   SPECIMENS REMOVED: None.   CONDITION FOLLOWING PROCEDURE: Stable.   PROCEDURE IN DETAIL: Risks, benefits, and alternatives, as well as the permanent nature of the procedure were discussed with the patient prior to proceeding to the operating room. The patient was taken to the operating room where she was administered general anesthesia via an LMA airway. She was positioned in the dorsal lithotomy position using Allen stirrups, prepped and draped in the usual sterile fashion. A timeout was performed. Attention was turned to the patient's pelvis. An operative speculum was placed. The cervix was visualized. The anterior lip grasped with a single-tooth tenaculum and a Hulka tenaculum was then placed without difficulty. The single-tooth tenaculum and speculum were removed without difficulty. The patient's bladder was straight catheterized with a red rubber prior to placing the tenaculum. Attention was then turned to the patient's abdomen. The umbilicus was infiltrated with 1% plain lidocaine. A stab incision was made at the base of the umbilicus and peritoneal entry was accomplished using direct visualization and an XL trocar. Pneumoperitoneum was  then established. A suprapubic 8 mm port was then placed under direct visualization. General inspection of the abdomen revealed the above findings. The left tube was was walked out to the fimbriated end and then grasped in the mid isthmic portion before applying the Falope ring.  The right tube was likewise walked out  to the fimbriated end and then ligated using a Falope ring in the midisthmic portion. Pneumoperitoneum was evacuated. The 5 mm umbilical trocar site was closed with Dermabond. The 8 mm trocar site was closed with 4-0 Monocryl subcuticularly and then had Dermabond applied as well. Sponge, needle, and instrument counts were correct x 2. The patient tolerated the procedure well and was taken to the recovery room in stable condition.    ____________________________ Florina OuAndreas M. Bonney AidStaebler, MD ams:dp D: 11/30/2012 14:49:07 ET T: 11/30/2012 15:15:12 ET JOB#: [id number]  cc: Florina OuAndreas M. Bonney AidStaebler, MD, <Dictator> Carmel SacramentoANDREAS Cathrine MusterM Ryhanna Dunsmore MD ELECTRONICALLY SIGNED 12/08/2012 8:57

## 2014-05-19 NOTE — Consult Note (Signed)
Brief Consult Note: Diagnosis: abdominal pain, 5 1/2 week IUP.   Patient was seen by consultant.   Consult note dictated.   Discussed with Attending MD.   Comments: Abdominal exam - absolutely no rebound or guarding. RLQ and LLQ soft and non-tender. SHe jumps with me touching her skin, but her abdominal wall is non-tender even to sharp and fairly deep palpation, particularly when she's distracted. WBC 11. U/S - appendix 6 mm. I do not believe she has appendicitis and I do not feel that any other tests are necessary and she also needs no surgical follow up.  Electronic Signatures: Claude MangesMarterre, Lennie Vasco F (MD)  (Signed 03-Mar-14 20:38)  Authored: Brief Consult Note   Last Updated: 03-Mar-14 20:38 by Claude MangesMarterre, Xiadani Damman F (MD)

## 2014-05-19 NOTE — Consult Note (Signed)
PATIENT NAME:  Holly Berry, Holly Berry MR#:  [id number] DATE OF BIRTH:  24-Apr-1985  DATE OF CONSULTATION:  03/29/2012  EMERGENCY DEPARTMENT VISIT (CONSULTATION)  REFERRING PHYSICIAN:   CONSULTING PHYSICIAN:  Claude MangesWilliam F. Marterre, MD  HISTORY OF PRESENT ILLNESS: Ms. Delford FieldCash is a 29 year old white female who found out that she was pregnant about a week ago and 2-1/2 days ago began experiencing severe nausea. One and half days ago she experienced vomiting and abdominal pain and she took an Ambien last night and was able to sleep until approximately 7:00 this morning when she was awoken with more abdominal pain. She denies fevers, but says that she has had chills. She had a normal bowel movement yesterday, although she says she has to strain to defecate and she has some pain with defecation. She is currently hungry. The patient was [facility] to [facility] in March of 2013 with abdominal pain and a left ovarian cyst and was discharged home after CT scan showed a normal appendix.   PAST MEDICAL HISTORY: History of borderline personality disorder, diagnosed November 2011. She was also hospitalized in September of 2012 with suicidal ideation and suicidal behavior and a diagnosis of depression.   MEDICATIONS: None other than the Ambien mentioned in the history of present illness.   ALLERGIES:  PENICILLIN AND TRAMADOL.  SOCIAL HISTORY: The patient lives with her boyfriend who is the father of this child. She has 3 other children, ages 495, 74 and 2, they are currently living with their father. Her mom is recently hospitalized with a myocardial infarction. She was smoking 1 pack of cigarettes per day until she found out that she was pregnant, when she cut down to 3 cigarettes per day.  She does not drink appreciable amounts of alcohol.   REVIEW OF SYSTEMS: Negative for 10 systems, except the gastrointestinal system, as mentioned in the history of present illness.   PAST SURGICAL HISTORY: Laparoscopic  cholecystectomy on 01/04/2010.   FAMILY HISTORY: Noncontributory.   PHYSICAL EXAMINATION: GENERAL: Healthy, obese young female lying comfortably on the Emergency Department stretcher who is in no apparent physical distress. Height is 5 feet 7 inches, weight 198 pounds and BMI 31.0.  VITAL SIGNS: Temperature 98.3, pulse 86, respirations 20, blood pressure 128/85 and oxygen saturation 99% on room air at rest.  HEENT: Pupils equally round and reactive to light. Extraocular movements intact. Sclerae anicteric. Oropharynx clear. Mucous membranes moist. Hearing intact to voice.  NECK: Supple with no thyroid enlargement, lymphadenopathy, tracheal deviation or jugular venous distention.  HEART: Regular rate and rhythm with no murmurs or rubs.  LUNGS: Clear to auscultation with normal respiratory effort bilaterally.  ABDOMEN: Obese, soft, nontender and nondistended. Specifically, the patient has some reaction to touching her skin, but when her abdominal wall is mobilized, even sharply and deeply in lower quadrants, she has absolutely no rebound tenderness and no involuntary guarding.  EXTREMITIES: No edema with normal capillary refill bilaterally.  NEUROLOGIC: Cranial nerves II through XII, motor and sensation grossly intact.  PSYCHIATRIC: Alert and oriented x 4 with appropriate affect.  OTHER STUDIES: White blood cell count 11,000. Otherwise CBC and CMP are normal. The wet prep shows many white blood cells with clue cells and urinalysis is normal.   Ultrasound of the pelvis is consistent with a 5 week, 3 day intrauterine pregnancy and ultrasound of the right lower quadrant shows a normal 6 mm appendix with a minimal amount of fluid surrounding it.   ASSESSMENT AND RECOMMENDATIONS: Abdominal pain with  no objective findings that make me suspicious for acute appendicitis. I do not believe the patient needs any further laboratory or radiological examinations and she also does not require a surgical followup.   ____________________________ Claude Manges, MD wfm:sb D: 03/29/2012 21:03:17 ET     T: 03/30/2012 07:54:21 ET        JOB#: [id number] cc: Claude Manges, MD, <Dictator> Claude Manges MD ELECTRONICALLY SIGNED 03/30/2012 20:10

## 2014-05-21 NOTE — Consult Note (Signed)
Asked to see pt for lower abd pain of about 1 weeks duration.  Kn own to my practice due to pregnancy.  Has history of pelvic pain -- pt reports laparoscopy to "scrape cysts off my ovaries", apparently done by my partner.pain began about 1.5 weeks ago.  [facility] that showed a 3+cm LOV cyst.until U/S, Pt reports that all apin was on her right; after told of the left-sided cyst, the pain shifted more to the lefthas episodic diarrhea and nausea shows normal appx -- S/P lap chole; laparoscopy; orthopedic procedures tramadol, dilaudid-- N/C Baseline affect           Abd-- mildly tympanitic.  No rebound.  Good Bowel Sounds x 4  Back -- no CVAT  Extrem-- NT  HEENT-- other than piercings near eyes, normal  Respiratory-- normal effort abdominal/pelvic pain, most likely not related to small, simple ovarian cyst           chronic pelvic pain           Long-acting progesterone contraceptive (nexplanon) -- may make functional cysts last longer           small, simple ovarian cyst           Diarrhea, nausea, mild tympanny, possibly gastroenteritis will give Zofran 8mg  ODT; if can tolerate fluids, will let go home on zofran and vicodin         [facility]         I do not feel surgery is warranted for this 3+cm simple ov cyst, as vast majority will resolve   Electronic Signatures: Margaretha GlassingEvans, Ricky L (MD)  (Signed on 30-Mar-13 10:14)  Authored  Last Updated: 30-Mar-13 10:14 by Margaretha GlassingEvans, Ricky L (MD)

## 2014-05-21 NOTE — H&P (Signed)
PATIENT NAME:  Holly Berry, Holly Berry MR#:  [id number] DATE OF BIRTH:  04/10/1985  DATE OF ADMISSION:  04/24/2011  CHIEF COMPLAINT: Abdominal pain.   HISTORY OF PRESENT ILLNESS: The patient began having some abdominal pain, more on the right side, about a week ago. This been somewhat intermittent and has appeared to come and go. She had a transvaginal ultrasound done as an outpatient which shows a left ovarian cyst. She had had some vomiting with this and for the last four days has had diarrhea. Work-up in the Emergency Department shows a white blood cell count of 11,600 with normal hemoglobin, etc. Urinalysis is completely negative. Her chemistries are within normal range. CT scan shows a normal appendix. No other intestinal or solid organ findings and a left ovarian cyst. The patient's pain has persisted in spite of analgesics.   PAST MEDICAL HISTORY:  1. 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  2. She has had a laparoscopic cholecystectomy about a year ago.  3. She has had surgery on her knee three different times. 4. Some kind of surgery on her foot.  5. Also had a fracture of some of the bones in her right hand.  6. She has not had heart disease or diabetes or any other serious illnesses.   SOCIAL HISTORY: She does smoke about 1/2 pack per day. Rare alcohol and no other drug use.   MEDICATION: Her only medication at this time is ibuprofen 800 mg 3 times daily which has not helped.   DRUG ALLERGIES: She is allergic to penicillin, Dilaudid and tramadol.   REVIEW OF SYSTEMS: Review of systems is carried out and other than the present illness had no other positive findings.   PHYSICAL EXAMINATION:  HEENT: Negative except some dryness of the oral mucous membranes.   NECK: Supple without masses.   HEART: Regular rhythm and no murmurs.   LUNGS: Lungs are clear.   ABDOMEN: The abdomen is generally tender and is difficult to localize. It seems to be more tender in the right lower  quadrant but not well circumscribed. There is some tenderness in the left side also that seems to be referred to the right side. Bowel sounds are present. There is no palpable mass.   EXTREMITIES: Not remarkable.   NEUROLOGICAL: Not remarkable.   IMPRESSION: Abdominal pain, etiology undetermined.   PLAN: I sincerely doubt this patient has appendicitis but she is uncomfortable enough we need to pursue this further. I am also going to request a gynecologist consultation and keep her in observation at least until tomorrow.   ____________________________ Yaakov GuthrieGerald Berry. Pernell DupreAdams, MD gla:cms D: 04/24/2011 19:40:04 ET T: 04/25/2011 10:02:13 ET JOB#: [id number]  cc: Earvin HansenGerald Berry. Pernell DupreAdams, MD, <Dictator> Elmer SowGERALD Berry Nakisha Chai MD ELECTRONICALLY SIGNED 04/29/2011 13:25

## 2014-05-21 NOTE — Discharge Summary (Signed)
PATIENT NAME:  Holly Berry, Holly Berry MR#:  [id number] DATE OF BIRTH:  02-26-1985  DATE OF ADMISSION:  04/24/2011 DATE OF DISCHARGE:  04/27/2011  DISCHARGE DIAGNOSES:  Abdominal pain, left ovarian cyst, obesity.   CONSULTANTS: Dr. Logan BoresEvans, GYN.   PROCEDURES: None.   HISTORY OF PRESENT ILLNESS/HOSPITAL COURSE: This is a patient who was seen first by Dr. Pernell DupreAdams in the Emergency Room where she was admitted with a diagnosis of lower abdominal pain which had been going on for approximately eight days prior to admission. She had nausea and vomiting, and a CT scan showed a normal appendix and a left ovarian cyst. Her white blood cell count was normal. She was admitted to the hospital for observation and serial exams were performed roughly twice a day over the next few days. Her pain persisted as did her nausea and she vomited on occasion. Her pain, however, and physical exam did not progress to show signs of peritoneal irritation. She never had guarding, rebound, or percussion tenderness and persisted with pain in both lower quadrants and suprapubic area, more so in the left on the day of discharge.   Dr. Logan BoresEvans was consulted who agreed that this was probably a chronic pelvic pain syndrome and wanted to arrange for the patient to see a [facility]. The patient's nausea was ultimately controlled with Phenergan suppositories, which seemed to work better than Zofran. She has not vomited now for at least 12 hours, and her pain, albeit present, is controlled somewhat by either Vicodin or Percocet. She has no preference as to a discharge medication.   I discussed with her and her mother the rationale for discharge today and the options of continued observation. We also discussed being sent home with Percocet and Phenergan suppositories. I have asked her to call Dr. Logan BoresEvans' office in the morning for arranging the followup with [facility],  which is what Dr. Logan BoresEvans and I discussed by telephone prior to the patient's discharge yesterday.    The patient was in agreement with this plan and she will be discharged in stable condition. She is currently tolerating a regular diet.   ____________________________ Adah Salvageichard E. Excell Seltzerooper, MD rec:bjt D: 04/27/2011 15:47:06 ET T: 04/29/2011 11:20:35 ET JOB#: [id number]  cc: Adah Salvageichard E. Excell Seltzerooper, MD, <Dictator> Lattie HawICHARD E Teruko Joswick MD ELECTRONICALLY SIGNED 04/29/2011 12:31

## 2014-05-28 NOTE — Consult Note (Signed)
PATIENT NAME:  Holly Berry, KEPPLE MR#:  [id number] DATE OF BIRTH:  [birth year]  DATE OF CONSULTATION:  02/08/2014  REFERRING PHYSICIAN:   CONSULTING PHYSICIAN:  Audery Amel, MD  IDENTIFYING INFORMATION AND REASON FOR CONSULTATION: A 29 year old woman with a history of mood instability, brought into the hospital and placed under petition for taking an overdose.   CHIEF COMPLAINT: "I need to be with my kids."   HISTORY OF PRESENT ILLNESS: Information obtained from the patient and the chart. The patient admits that yesterday she took an overdose of about 8 Ambien and 1 Vicodin. Says that she was feeling overwhelmed with emotion at the time. Felt like she could not cope anymore. She is vague about intent, but admits that this is an excessive overdose and that at the time there were some suicidal thoughts. Mood recently has been anxious and depressed, also irritable. Sleeps poorly at night. Feels panicky a lot. Increased anger. Has been fighting a lot with her mother and her kids and her boyfriend. Boyfriend walked out on her yesterday, which may have been a precipitating stress. The patient claims that she is not drinking regularly or abusing drugs. She is not currently getting any outpatient psychiatric treatment. Symptoms have been going on for at least several weeks, but with an escalation in the last few days.   PAST PSYCHIATRIC HISTORY: Has a history of 1 prior hospitalization here also with a suicide attempt. Diagnosis of depression or bipolar disorder. Previously treated with Abilify, but has not been seeing a doctor for as much as a year.   PAST MEDICAL HISTORY: No known significant ongoing medical problems.   SOCIAL HISTORY: The patient is unmarried, but was with a boyfriend who walked out on her yesterday. She has 4 young children under the age of 7 at home. Lives with her children and her 73 year old mother. Not working. Feels overwhelmed with stress.   SUBSTANCE ABUSE HISTORY: Claims that  she uses alcohol only rarely and denies any ongoing drug abuse.   FAMILY HISTORY: Says that her mother has bipolar disorder and fibromyalgia.  CURRENT MEDICATIONS: None.   ALLERGIES: PENICILLIN AND TRAMADOL.   REVIEW OF SYSTEMS: Tearful. Sad. Tired. Overwhelmed. Denies any active suicidal ideation. Denies any hallucinations.    MENTAL STATUS EXAMINATION: Disheveled woman, looks her stated age. Eye contact decreased. Psychomotor activity marked by sobbing and agitation throughout the interview. Speech is loud at times, but decreased in total amount. Thoughts dominated by repeating over and over how she needs to be with her children. Affect marked by extreme sobbing. Denies auditory or visual hallucinations. Denies suicidal or homicidal ideation. Can repeat 3 objects immediately, remembers 3 at 3 minutes. Alert and oriented x 4. Judgment and insight poor. Intelligence average.   LABORATORY RESULTS: Urinalysis unremarkable. Pregnancy test negative. Drug screen all negative. TSH normal. CBC, slightly elevated white count of 12.2. Alcohol negative. Chemistry unremarkable.   VITAL SIGNS: Blood pressure 114/87, respirations 18, pulse 109, temperature 98.9.   ASSESSMENT: A 29 year old woman with a history of depression and mood instability, took an intentional overdose with some suicidal intent last night. Today she is denying suicidal ideation, but she remains extremely upset, overwhelmed, cannot stop crying, cannot think clearly. Does not seem to be in any shape to take care of her children or herself right now and appears to be at high risk of repeated dangerous behavior. Needs hospitalization for stabilization.   TREATMENT PLAN: Admit to psychiatry. Suicide precautions in place. Restart Abilify 10 mg a  day which she was taking in the past. Psychoeducation and counseling completed.   DIAGNOSIS PRINCIPAL AND PRIMARY:   AXIS I: Major depression.   SECONDARY DIAGNOSES:   AXIS I: Rule out borderline  personality disorder versus bipolar disorder not otherwise specified.   AXIS II:  1.  Overweight.   2.  Acne.     ____________________________ Audery AmelJohn T. Dino Borntreger, MD jtc:bu D: 02/08/2014 14:49:32 ET T: 02/08/2014 15:05:52 ET JOB#: [id number]  cc: Audery AmelJohn T. Tayler Heiden, MD, <Dictator> Audery AmelJOHN T Ladana Chavero MD ELECTRONICALLY SIGNED 03/08/2014 17:18

## 2014-05-28 NOTE — H&P (Signed)
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   CHIEF COMPLAINT: "I need to go home and take care of my child."   HISTORY OF PRESENT ILLNESS: Ms. Wedin is a 29 year old Caucasian female who presented to the Emergency Department. She was taken by a friend after she was reported taking 2 Ambien and 1 Vicodin. Today, the patient was interviewed. She denies having suicidal ideation prior to admission and denied overdosing. The patient explains that earlier that day, she had an argument with her boyfriend, who is the father of her 2 younger children. They broke up. That evening, the patient attempted to go to sleep "in her mind;" therefore, she took 1 or 2 tablets of 10 mg Ambien that belong to her mother. After taking this medication, the patient states she does not remember much of what happened. She said that she remembers having a conversation with her friend and then being taken to the Emergency Department. The patient reports having a past history of depression and having multiple stressors. The patient said that for the last week, she has been feeling she is "a ticking time bomb" and has been trying to be scheduled to see a psychiatrist without success. She reports that in 2012, her brother was killed and hit by a train at the age of 41. The year after that, in 2013, her stepfather committed suicide in front of the patient's mother by shooting himself. Then, later that year, her mother had a massive heart attack and  the patient became her main caregiver. In addition, the patient has 3 young children that she has to care for. Her younger child was born premature at 38 weeks and 6 days. The patient claims that he is frequently sick; therefore, she is unable to send him to  daycare. 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 Her boyfriend, the father of her 2 young children, was living with them and was helping out financially. The patient was diagnosed, years ago, with bipolar disorder, but states that the medication made her "feel like a zombie" and therefore, she stopped the medications. She has been without treatment for about a year. In terms of substance abuse, the patient reports only drinking alcohol "once in a blue moon". She was a heavy user of marijuana for 3-4 years; however, she last used marijuana 2 years ago. She said that when younger, she abused pain medications after having multiple surgeries for different fractures. However, she said that she has not abuse pain medication in a long time. She does smoke about 1 pack of cigarettes per day. The patient denies any history of sexual or physical abuse.   PAST PSYCHIATRIC HISTORY: The patient was diagnosed with intermittent explosive disorder and bipolar disorder by a psychiatrist at [facility] about a year ago. She was started on medication. It is unclear as to what the medication was; however, the patient had some side effects and therefore, she stopped. The patient denies any history of prior suicidal attempts and denies any history of prior psychiatric hospitalization. The patient does report 1 prior hospitalization in 2010 after she overdosed. Claimed that she was hospitalized here for 1 day and then discharged. The  patient reports history of self-injurious behavior when she was a teenager, but not as an adult. At the age of 29 is when she was first diagnosed with bipolar disorder and was prescribed with Depakote.  PAST MEDICAL HISTORY: The patient has a history of migraines and has obesity and acne.   MEDICATIONS: Currently not taking any medications.   PAST SURGICAL HISTORY:  The patient has had multiple surgeries for  gallbladder removal. She had a right hand fracture, left foot fracture, knee injury, and she had a laparoscopic procedure due to endometriosis. The patient denies any history of seizures and denies any history of  head trauma.   FAMILY HISTORY: The patient reports that her mother has been diagnosed with bipolar disorder and has attempted suicide in the past. She reports that her biological father suffers from alcoholism.    SOCIAL HISTORY: The patient is currently single. She [place] in [place] with her mother and her 3 young children. The oldest is 27 years old and the youngest child is a 27-year-old. The patient is currently not working. 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 Recently, she left her job in order to take care of her mother and her younger child, while her boyfriend was helping them financially. The patient has a 10th grade education. She dropped out of high school in the 11th grade. She obtained her GED later on, and plans to return to school to obtain a CNA degree. The patient denies any legal charges.   ALLERGIES: PENICILLIN AND ULTRAM.   REVIEW OF SYSTEMS: The patient complains of diarrhea. The rest of the 10 review of systems is negative.   MENTAL STATUS EXAMINATION: The patient is an obese, 29 year old Caucasian female who appears her stated age. She is disheveled. She is wearing hospital scrubs. Behavior: The patient was tearful at the beginning of the interview, describing distress about not being around her children and worrying about them. Later on, the patient was calmer, was easy to interview, and was cooperative. Her eye contact was within normal range. Her speech had regular tone, volume, and rate. Thought process was linear. Thought content was negative for suicidality, negative for homicidality, negative for auditory or visual hallucinations. Her mood was dysphoric., Tearful at times. Her affect was reactive and was appropriate. Insight is  fair. Judgment is limited. Clinical examination: She is alert and oriented in person, place, time, and situation. Attention and concentration appear to be grossly intact; however, it was not formally tested. Fund of knowledge appears to be average for her level of education.   PHYSICAL EXAMINATION:  GENERAL: The patient is an obese 29 year old Caucasian female in no acute distress.  VITAL SIGNS: Blood pressure 142/82, pulse 102, temperature 99.3.  NEUROLOGICAL: Gait appears within normal limits. Muscular tone appears within normal limits. No evidence of involuntary movements.   LABORATORY DATA RESULTS: Urine pregnancy was negative. A comprehensive metabolic panel was within the normal limits. Alcohol level was below detection limit. TSH is 1.77. Urine toxicology is negative. There were reports that the patient had used Vicodin; however, again, her toxicology screen was negative for opiates. CBC is within normal limits. UA is clear. Acetaminophen level was below detection limits. Salicylate levels were below detection limits.  EKG shows sinus tachycardia. QTC is 455.   DIAGNOSES: AXIS I: Major depressive disorder, recurrent, moderate. Cannabis use disorder, severe, in remission; and tobacco use disorder, severe. Nonpsychiatric medical issues obesity, migraines, acne. Psychosocial stressors: Mother and child health issues, financial stressors,  relational problems with partner.   ASSESSMENT:  The patient is a young Caucasian female with multiple psychosocial stressors, that presented to the Emergency Department after a questionable misuse of hypnotics, a questionable overdose versus misuse of hypnotics. Currently, the patient is denying having suicidality and denies that prior to taking the hypnotics, she was having suicidal ideations. The patient was cooperative with the assessment and is requesting to be started on medications to address depressive symptoms.   PLAN: For major depressive disorder, the  patient will be started on fluoxetine 10 mg p.o. daily. For insomnia, the patient will be started on mirtazapine 15 mg p.o. at bedtime. For tobacco use disorder, she will be prescribed with nicotine inhaler p.r.n.   ADMISSION STATUS: The patient will not be continued on involuntary commitment. She will make voluntary patient.   PRECAUTIONS: The patient will be placed on routine precautions. Suicide precautions and elopement precautions will be discontinued.     ____________________________ Jimmy Footman, MD ahg:mw D: 02/09/2014 14:29:28 ET T: 02/09/2014 15:05:15 ET JOB#: [id number]  cc: Jimmy Footman, MD, <Dictator> Horton Chin MD ELECTRONICALLY SIGNED 02/17/2014 20:16

## 2014-06-06 NOTE — H&P (Signed)
L&D Evaluation:  History Expanded:  HPI 29 yo 28 weeks mild crampy abd pain, and  spotting over weekend. None today.  [facility] Care at [facility]. Recent tx for sinusitis.  Past h/o preclampsia.   Gravida 6   Term 3   PreTerm 0   Abortion 2   Living 3   Blood Type (Maternal) B positive   Patient's Medical History No Chronic Illness   Patient's Surgical History Colecystectomy  orthopedic surgeries   Medications Pre Natal Vitamins   Allergies PCN, tramadol   Social History none   Family History Non-Contributory   ROS:  ROS All systems were reviewed.  HEENT, CNS, GI, GU, Respiratory, CV, Renal and Musculoskeletal systems were found to be normal.   Exam:  Vital Signs stable   General no apparent distress   Mental Status clear   Abdomen gravid, non-tender   Estimated Fetal Weight Average for gestational age   Back no CVAT   Edema no edema   Pelvic no external lesions, FT and LONG, no Blood   Mebranes Intact   FHT normal rate with no decels   Ucx absent   Impression:  Impression Dehydration and cramping, prior bleeding bu none now   Plan:  Plan fluids   Follow Up Appointment already scheduled   Electronic Signatures: Letitia LibraHarris, Trasean Delima Paul (MD)  (Signed 11-Aug-14 15:50)  Authored: L&D Evaluation   Last Updated: 11-Aug-14 15:50 by Letitia LibraHarris, Anjenette Gerbino Paul (MD)

## 2014-06-06 NOTE — H&P (Signed)
L&D Evaluation:  History:  HPI 29 yo [id number] at 451w2d weeks mild crampy abd pain, and  spotting over weekend. was evaluted 3 days ago on L&D noted to be ftp dilated at internal left eye.  Seen in clinic today by different provider complaining of pain 3 times a day 9/10.  Was checked and called 2cm.    [facility]. Recent tx for sinusitis.  Past h/o preclampsia.   Patient's Medical History No Chronic Illness   Patient's Surgical History Colecystectomy  orthopedic surgeries   Medications Pre Natal Vitamins   Allergies PCN, tramadol   Social History none   Family History Non-Contributory   ROS:  ROS All systems were reviewed.  HEENT, CNS, GI, GU, Respiratory, CV, Renal and Musculoskeletal systems were found to be normal.   Exam:  Vital Signs stable   General no apparent distress   Mental Status clear   Abdomen gravid, non-tender   Estimated Fetal Weight Average for gestational age   Back no CVAT   Edema no edema   Pelvic no external lesions, 1/50/-4   Mebranes Intact   FHT normal rate with no decels   Ucx absent   Impression:  Impression Braxton hicks vs preterm labor   Plan:  Plan fluids   Comments - Given second admission and some cervical change will start BMZ course - FFN collected at clinic but not sent with patient, given change from Monday would not change my managment plan - If futher cervical change on recheck will start ampicillin for GBS unknown at <37 weeks, magnesium sulfate for CP ppx - UA sent   Electronic Signatures: Lorrene ReidStaebler, Teauna Dubach M (MD)  (Signed 14-Aug-14 17:47)  Authored: L&D Evaluation   Last Updated: 14-Aug-14 17:47 by Lorrene ReidStaebler, Oakes Mccready M (MD)

## 2014-06-06 NOTE — H&P (Signed)
L&D Evaluation:  History Expanded:  HPI 29 yo 22 weeks abd pain, nausea, spotting.  Last void this am, also one emesis this am.  Min diet today.  No bleeding now, was spotting d/c earlier.   Patient's Medical History No Chronic Illness   Patient's Surgical History none   Medications Pre Natal Vitamins   Allergies NKDA   Social History none   Family History Non-Contributory   ROS:  ROS All systems were reviewed.  HEENT, CNS, GI, GU, Respiratory, CV, Renal and Musculoskeletal systems were found to be normal.   Exam:  Vital Signs stable   General no apparent distress   Mental Status clear   Abdomen gravid, non-tender   Estimated Fetal Weight Average for gestational age   Back no CVAT   Edema no edema   Pelvic no external lesions, cervix closed and thick   Mebranes Intact   FHT normal rate with no decels   Ucx absent   Impression:  Impression Pain, nasuea, h/o bleeding but none currently, dehydration   Plan:  Plan UA, fluids   Electronic Signatures: Letitia LibraHarris, Caragh Gasper Paul (MD)  (Signed 03-Jul-14 22:24)  Authored: L&D Evaluation   Last Updated: 03-Jul-14 22:24 by Letitia LibraHarris, Aryn Safran Paul (MD)

## 2014-07-17 ENCOUNTER — Encounter: Payer: Self-pay | Admitting: Emergency Medicine

## 2014-07-17 ENCOUNTER — Emergency Department
Admission: EM | Admit: 2014-07-17 | Discharge: 2014-07-17 | Disposition: A | Discharge status: Home / Self Care | Payer: Medicaid Other | Attending: Emergency Medicine | Admitting: Emergency Medicine

## 2014-07-17 DIAGNOSIS — Z720 Tobacco use: Secondary | ICD-10-CM | POA: Insufficient documentation

## 2014-07-17 DIAGNOSIS — M545 Low back pain, unspecified: Secondary | ICD-10-CM

## 2014-07-17 DIAGNOSIS — N390 Urinary tract infection, site not specified: Secondary | ICD-10-CM | POA: Insufficient documentation

## 2014-07-17 DIAGNOSIS — Z880 Allergy status to penicillin: Secondary | ICD-10-CM | POA: Diagnosis not present

## 2014-07-17 DIAGNOSIS — Z3202 Encounter for pregnancy test, result negative: Secondary | ICD-10-CM | POA: Insufficient documentation

## 2014-07-17 LAB — URINALYSIS COMPLETE WITH MICROSCOPIC (ARMC ONLY)
BILIRUBIN URINE: NEGATIVE
GLUCOSE, UA: NEGATIVE mg/dL
HGB URINE DIPSTICK: NEGATIVE
KETONES UR: NEGATIVE mg/dL
Nitrite: NEGATIVE
Protein, ur: NEGATIVE mg/dL
Specific Gravity, Urine: 1.018 (ref 1.005–1.030)
pH: 5 (ref 5.0–8.0)

## 2014-07-17 LAB — POCT PREGNANCY, URINE: Preg Test, Ur: NEGATIVE

## 2014-07-17 MED ORDER — SULFAMETHOXAZOLE-TRIMETHOPRIM 800-160 MG PO TABS: 1.0000 | ORAL_TABLET | Freq: Two times a day (BID) | ORAL | Status: DC

## 2014-07-17 MED ORDER — ORPHENADRINE CITRATE 30 MG/ML IJ SOLN
INTRAMUSCULAR | Status: DC
Start: 2014-07-17 — End: 2014-07-17
  Filled 2014-07-17: qty 2

## 2014-07-17 MED ORDER — KETOROLAC TROMETHAMINE 60 MG/2ML IM SOLN
60.0000 mg | Freq: Once | INTRAMUSCULAR | Status: AC
  Administered 2014-07-17: 60 mg via INTRAMUSCULAR

## 2014-07-17 MED ORDER — DIAZEPAM 2 MG PO TABS
2.0000 mg | ORAL_TABLET | Freq: Once | ORAL | Status: AC
  Administered 2014-07-17: 2 mg via ORAL

## 2014-07-17 MED ORDER — KETOROLAC TROMETHAMINE 60 MG/2ML IM SOLN
INTRAMUSCULAR | Status: AC
  Administered 2014-07-17: 60 mg via INTRAMUSCULAR
  Filled 2014-07-17: qty 2

## 2014-07-17 MED ORDER — DIAZEPAM 2 MG PO TABS
ORAL_TABLET | ORAL | Status: AC
  Administered 2014-07-17: 2 mg via ORAL
  Filled 2014-07-17: qty 1

## 2014-07-17 MED ORDER — HYDROCODONE-ACETAMINOPHEN 5-325 MG PO TABS: 1.0000 | ORAL_TABLET | ORAL | Status: DC | PRN

## 2014-07-17 NOTE — ED Notes (Signed)
Having lower back pain for the past 3 weeks ..denies any injury states pain has gotten progressively worse lately. Pain is radiating into lower back  Ambulates well to treatment area

## 2014-07-17 NOTE — ED Notes (Signed)
Pt informed to return if any life threatening symptoms occur.  

## 2014-07-17 NOTE — ED Provider Notes (Signed)
[facility] Emergency Department Provider Note  ____________________________________________  Time seen: 10:29 AM  I have reviewed the triage vital signs and the nursing notes.   HISTORY  Chief Complaint Back Pain   HPI Holly Berry is a 29 y.o. female is here today with complaint of low back pain 3 weeks. Pain has been constant, sharp, and feels like her back is swollen. She denies any injury or previous history of back pain. She has taken over-the-counter medication without any relief. She denies any urinary symptoms. She is able to ambulate and states that there is increased pain with weightbearing. She complains at times that both her feet feel like they are numb.She denies any bowel or bladder incontinence. Currently her pain is 8/10. She states has had a tubal ligation.   History reviewed. No pertinent past medical history.  There are no active problems to display for this patient.   History reviewed. No pertinent past surgical history.  Current Outpatient Rx  Name  Route  Sig  Dispense  Refill  . HYDROcodone-acetaminophen (NORCO/VICODIN) 5-325 MG per tablet   Oral   Take 1 tablet by mouth every 4 (four) hours as needed for moderate pain.   20 tablet   0   . sulfamethoxazole-trimethoprim (BACTRIM DS,SEPTRA DS) 800-160 MG per tablet   Oral   Take 1 tablet by mouth 2 (two) times daily.   20 tablet   0     Allergies Penicillins  History reviewed. No pertinent family history.  Social History History  Substance Use Topics  . Smoking status: Current Every Day Smoker  . Smokeless tobacco: Not on file  . Alcohol Use: Yes    Review of Systems Constitutional: No fever/chills Eyes: No visual changes. ENT: No sore throat. Cardiovascular: Denies chest pain. Respiratory: Denies shortness of breath. Gastrointestinal: No abdominal pain.  No nausea, no vomiting.  No diarrhea.  No constipation. Genitourinary: Negative for  dysuria. Musculoskeletal: Positive for back pain. Skin: Negative for rash. Neurological: Negative for headaches, focal weakness. Positive numbness bilateral legs intermittently.  10-point ROS otherwise negative.  ____________________________________________   PHYSICAL EXAM:  VITAL SIGNS: ED Triage Vitals  Enc Vitals Group     BP 07/17/14 1021 130/92 mmHg     Pulse Rate 07/17/14 1021 91     Resp 07/17/14 1021 20     Temp 07/17/14 1021 97.9 F (36.6 C)     Temp Source 07/17/14 1021 Oral     SpO2 07/17/14 1021 99 %     Weight 07/17/14 1021 200 lb (90.719 kg)     Height 07/17/14 1021  (1.702 m)     Head Cir --      Peak Flow --      Pain Score 07/17/14 1020 8     Pain Loc --      Pain Edu? --      Excl. in GC? --     Constitutional: Alert and oriented. Well appearing and in no acute distress. Eyes: Conjunctivae are normal. PERRL. EOMI. Head: Atraumatic. Nose: No congestion/rhinnorhea. Neck: No stridor.   Cardiovascular: Normal rate, regular rhythm. Grossly normal heart sounds.  Good peripheral circulation. Respiratory: Normal respiratory effort.  No retractions. Lungs CTAB. Gastrointestinal: Soft and nontender. No distention. No abdominal bruits. No CVA tenderness. Musculoskeletal: No lower extremity tenderness nor edema.  No joint effusions.  Back exam: No gross deformity. There is tenderness right lower paravertebral muscles lumbar spine. Range of motion is slightly restricted secondary to pain.  Reflexes are 2+ bilaterally and equal strength equal bilaterally. Range of motion is guarded secondary to pain. Neurologic:  Normal speech and language. No gross focal neurologic deficits are appreciated. Speech is normal. No gait instability. Skin:  Skin is warm, dry and intact. No rash noted. Psychiatric: Mood and affect are normal. Speech and behavior are normal.  ____________________________________________   LABS (all labs ordered are listed, but only abnormal results  are displayed)  Labs Reviewed  URINALYSIS COMPLETEWITH MICROSCOPIC ([facility] ONLY) - Abnormal; Notable for the following:    Color, Urine YELLOW (*)    APPearance CLOUDY (*)    Leukocytes, UA 3+ (*)    Bacteria, UA RARE (*)    Squamous Epithelial / LPF TOO NUMEROUS TO COUNT (*)    All other components within normal limits  POC URINE PREG, ED  POCT PREGNANCY, URINE    PROCEDURES  Procedure(s) performed: None  Critical Care performed: No  ____________________________________________   INITIAL IMPRESSION / ASSESSMENT AND PLAN / ED COURSE  Pertinent labs & imaging results that were available during my care of the patient were reviewed by me and considered in my medical decision making (see chart for details).  Patient states she does not have a current PCP. She states that her Medicaid card is "messed up". She is told to return to the emergency room if any severe worsening, fever, nausea and vomiting, or unable to take medication. She is to increase fluids, take Septra DS for 10 days, and Norco as needed for pain. ____________________________________________   FINAL CLINICAL IMPRESSION(S) / ED DIAGNOSES  Final diagnoses:  Acute urinary tract infection  Midline low back pain without sciatica      Tommi Rumps, PA-C 07/17/14 1209  Jene Every, MD 07/17/14 1547

## 2014-07-17 NOTE — ED Notes (Signed)
Pt reports that she has been having lower back pain for the last three weeks. Denies any GU symptoms or known injury. She is able to ambulate and bear weight. Pt reports that her back is swollen.

## 2014-09-07 ENCOUNTER — Encounter: Payer: Self-pay | Admitting: Emergency Medicine

## 2014-09-07 ENCOUNTER — Emergency Department: Payer: Medicaid Other

## 2014-09-07 ENCOUNTER — Emergency Department
Admission: EM | Admit: 2014-09-07 | Discharge: 2014-09-07 | Disposition: A | Discharge status: Home / Self Care | Payer: Medicaid Other | Attending: Emergency Medicine | Admitting: Emergency Medicine

## 2014-09-07 DIAGNOSIS — S60211A Contusion of right wrist, initial encounter: Secondary | ICD-10-CM | POA: Diagnosis not present

## 2014-09-07 DIAGNOSIS — Z880 Allergy status to penicillin: Secondary | ICD-10-CM | POA: Insufficient documentation

## 2014-09-07 DIAGNOSIS — W228XXA Striking against or struck by other objects, initial encounter: Secondary | ICD-10-CM | POA: Diagnosis not present

## 2014-09-07 DIAGNOSIS — S6991XA Unspecified injury of right wrist, hand and finger(s), initial encounter: Secondary | ICD-10-CM | POA: Diagnosis present

## 2014-09-07 DIAGNOSIS — Y9344 Activity, trampolining: Secondary | ICD-10-CM | POA: Insufficient documentation

## 2014-09-07 DIAGNOSIS — Y9289 Other specified places as the place of occurrence of the external cause: Secondary | ICD-10-CM | POA: Insufficient documentation

## 2014-09-07 DIAGNOSIS — Z792 Long term (current) use of antibiotics: Secondary | ICD-10-CM | POA: Insufficient documentation

## 2014-09-07 DIAGNOSIS — Z720 Tobacco use: Secondary | ICD-10-CM | POA: Diagnosis not present

## 2014-09-07 DIAGNOSIS — Y998 Other external cause status: Secondary | ICD-10-CM | POA: Diagnosis not present

## 2014-09-07 MED ORDER — NAPROXEN 500 MG PO TABS: 500.0000 mg | ORAL_TABLET | Freq: Two times a day (BID) | ORAL | Status: AC
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
## 2014-09-07 NOTE — ED Provider Notes (Signed)
[facility] Emergency Department Provider Note ____________________________________________  Time seen: Approximately 10:09 AM  I have reviewed the triage vital signs and the nursing notes.   HISTORY  Chief Complaint Wrist Pain   HPI Holly Berry is a 29 y.o. female who presents to the emergency department for evaluation of right wrist pain. She was jumping on the trampoline with her children yesterday and hit her wrist on the metal support. She has had pain and swelling without relief with ibuprofen, heat, and, ice.  History reviewed. No pertinent past medical history.  There are no active problems to display for this patient.   History reviewed. No pertinent past surgical history.  Current Outpatient Rx  Name  Route  Sig  Dispense  Refill  . HYDROcodone-acetaminophen (NORCO/VICODIN) 5-325 MG per tablet   Oral   Take 1 tablet by mouth every 4 (four) hours as needed for moderate pain.   20 tablet   0   . naproxen (NAPROSYN) 500 MG tablet   Oral   Take 1 tablet (500 mg total) by mouth 2 (two) times daily with a meal.   60 tablet   0   . sulfamethoxazole-trimethoprim (BACTRIM DS,SEPTRA DS) 800-160 MG per tablet   Oral   Take 1 tablet by mouth 2 (two) times daily.   20 tablet   0     Allergies Penicillins  History reviewed. No pertinent family history.  Social History Social History  Substance Use Topics  . Smoking status: Current Every Day Smoker  . Smokeless tobacco: None  . Alcohol Use: Yes    Review of Systems Constitutional: No recent illness. Eyes: No visual changes. ENT: No sore throat. Cardiovascular: Denies chest pain or palpitations. Respiratory: Denies shortness of breath. Gastrointestinal: No abdominal pain.  Genitourinary: Negative for dysuria. Musculoskeletal: Pain in right wrist. Skin: Negative for rash. Neurological: Negative for headaches, focal weakness or numbness. 10-point ROS otherwise  negative.  ____________________________________________   PHYSICAL EXAM:  VITAL SIGNS: ED Triage Vitals  Enc Vitals Group     BP 09/07/14 0916 120/75 mmHg     Pulse Rate 09/07/14 0916 77     Resp --      Temp 09/07/14 0916 98 F (36.7 C)     Temp Source 09/07/14 0916 Oral     SpO2 09/07/14 0916 99 %     Weight 09/07/14 0916 200 lb (90.719 kg)     Height 09/07/14 0916 5\' 7"  (1.702 m)     Head Cir --      Peak Flow --      Pain Score 09/07/14 0914 10     Pain Loc --      Pain Edu? --      Excl. in GC? --     Constitutional: Alert and oriented. Well appearing and in no acute distress. Eyes: Conjunctivae are normal. EOMI. Head: Atraumatic. Nose: No congestion/rhinnorhea. Neck: No stridor.  Respiratory: Normal respiratory effort.   Musculoskeletal: Pain on lateral aspect of right wrist with small contusion and mild swelling. ROM limited due to pain. Neurologic:  Normal speech and language. No gross focal neurologic deficits are appreciated. Speech is normal. No gait instability. Skin:  Skin is warm, dry and intact. Atraumatic. Psychiatric: Mood and affect are normal. Speech and behavior are normal.  ____________________________________________   LABS (all labs ordered are listed, but only abnormal results are displayed)  Labs Reviewed - No data to display ____________________________________________  RADIOLOGY  Negative for acute abnormality. ____________________________________________  PROCEDURES  Procedure(s) performed: ACE bandage applied to right wrist by Mellody Dance, ER tech. Neurovascularly intact post application.   ____________________________________________   INITIAL IMPRESSION / ASSESSMENT AND PLAN / ED COURSE  Pertinent labs & imaging results that were available during my care of the patient were reviewed by me and considered in my medical decision making (see chart for details).  Patient was advised to use ice, rest, and elevation. She was advised  to follow-up with orthopedics for symptoms that are not improving over the week. She was advised to return to the emergency department for symptoms that change or worsen if she is unable schedule an appointment. ____________________________________________   FINAL CLINICAL IMPRESSION(S) / ED DIAGNOSES  Final diagnoses:  Wrist contusion, right, initial encounter       Chinita Pester, FNP 09/07/14 1029  Emily Filbert, MD 09/07/14 1558

## 2014-09-07 NOTE — ED Notes (Signed)
Patient to ED with report of right wrist pain, reports injuring at home.

## 2014-11-22 ENCOUNTER — Emergency Department
Admission: EM | Admit: 2014-11-22 | Discharge: 2014-11-22 | Disposition: A | Discharge status: Home / Self Care | Payer: Medicaid Other | Attending: Emergency Medicine | Admitting: Emergency Medicine

## 2014-11-22 ENCOUNTER — Emergency Department: Payer: Medicaid Other

## 2014-11-22 ENCOUNTER — Encounter: Payer: Self-pay | Admitting: Emergency Medicine

## 2014-11-22 DIAGNOSIS — Z720 Tobacco use: Secondary | ICD-10-CM | POA: Insufficient documentation

## 2014-11-22 DIAGNOSIS — R42 Dizziness and giddiness: Secondary | ICD-10-CM | POA: Insufficient documentation

## 2014-11-22 DIAGNOSIS — R0600 Dyspnea, unspecified: Secondary | ICD-10-CM | POA: Diagnosis not present

## 2014-11-22 DIAGNOSIS — R05 Cough: Secondary | ICD-10-CM | POA: Insufficient documentation

## 2014-11-22 DIAGNOSIS — R197 Diarrhea, unspecified: Secondary | ICD-10-CM

## 2014-11-22 DIAGNOSIS — R112 Nausea with vomiting, unspecified: Secondary | ICD-10-CM | POA: Insufficient documentation

## 2014-11-22 DIAGNOSIS — R1032 Left lower quadrant pain: Secondary | ICD-10-CM

## 2014-11-22 DIAGNOSIS — Z880 Allergy status to penicillin: Secondary | ICD-10-CM | POA: Diagnosis not present

## 2014-11-22 DIAGNOSIS — R1031 Right lower quadrant pain: Secondary | ICD-10-CM

## 2014-11-22 DIAGNOSIS — R6883 Chills (without fever): Secondary | ICD-10-CM | POA: Diagnosis not present

## 2014-11-22 DIAGNOSIS — Z792 Long term (current) use of antibiotics: Secondary | ICD-10-CM | POA: Insufficient documentation

## 2014-11-22 DIAGNOSIS — R61 Generalized hyperhidrosis: Secondary | ICD-10-CM | POA: Insufficient documentation

## 2014-11-22 DIAGNOSIS — Z791 Long term (current) use of non-steroidal anti-inflammatories (NSAID): Secondary | ICD-10-CM | POA: Insufficient documentation

## 2014-11-22 DIAGNOSIS — J029 Acute pharyngitis, unspecified: Secondary | ICD-10-CM | POA: Insufficient documentation

## 2014-11-22 DIAGNOSIS — N390 Urinary tract infection, site not specified: Secondary | ICD-10-CM

## 2014-11-22 HISTORY — DX: Unspecified ovarian cyst, unspecified side: N83.209

## 2014-11-22 LAB — COMPREHENSIVE METABOLIC PANEL
ALT: 22 U/L (ref 14–54)
ANION GAP: 8 (ref 5–15)
AST: 21 U/L (ref 15–41)
Albumin: 4.3 g/dL (ref 3.5–5.0)
Alkaline Phosphatase: 83 U/L (ref 38–126)
BILIRUBIN TOTAL: 0.4 mg/dL (ref 0.3–1.2)
BUN: 13 mg/dL (ref 6–20)
CO2: 26 mmol/L (ref 22–32)
Calcium: 9.3 mg/dL (ref 8.9–10.3)
Chloride: 105 mmol/L (ref 101–111)
Creatinine, Ser: 0.75 mg/dL (ref 0.44–1.00)
GFR calc Af Amer: >60 mL/min (ref >60)
GFR calc non Af Amer: >60 mL/min (ref >60)
Glucose, Bld: 98 mg/dL (ref 65–99)
POTASSIUM: 3.9 mmol/L (ref 3.5–5.1)
Sodium: 139 mmol/L (ref 135–145)
Total Protein: 7.9 g/dL (ref 6.5–8.1)

## 2014-11-22 LAB — POCT PREGNANCY, URINE: PREG TEST UR: NEGATIVE

## 2014-11-22 LAB — CBC
HEMATOCRIT: 42.4 % (ref 35.0–47.0)
Hemoglobin: 13.9 g/dL (ref 12.0–16.0)
MCH: 27.7 pg (ref 26.0–34.0)
MCHC: 32.7 g/dL (ref 32.0–36.0)
MCV: 84.6 fL (ref 80.0–100.0)
Platelets: 298 10*3/uL (ref 150–440)
RBC: 5.01 MIL/uL (ref 3.80–5.20)
RDW: 14.2 % (ref 11.5–14.5)
WBC: 12.2 10*3/uL — AB (ref 3.6–11.0)

## 2014-11-22 LAB — URINALYSIS COMPLETE WITH MICROSCOPIC (ARMC ONLY)
BACTERIA UA: NONE SEEN
Bilirubin Urine: NEGATIVE
Glucose, UA: NEGATIVE mg/dL
Hgb urine dipstick: NEGATIVE
Ketones, ur: NEGATIVE mg/dL
NITRITE: NEGATIVE
PROTEIN: NEGATIVE mg/dL
Specific Gravity, Urine: 1.021 (ref 1.005–1.030)
pH: 5 (ref 5.0–8.0)

## 2014-11-22 LAB — LIPASE, BLOOD: Lipase: 32 U/L (ref 11–51)

## 2014-11-22 MED ORDER — IOHEXOL 240 MG/ML SOLN
25.0000 mL | Freq: Once | INTRAMUSCULAR | Status: AC | PRN
  Administered 2014-11-22: 25 mL via ORAL
  Filled 2014-11-22: qty 25

## 2014-11-22 MED ORDER — HYDROMORPHONE HCL 1 MG/ML IJ SOLN
INTRAMUSCULAR | Status: AC
  Administered 2014-11-22: 1 mg via INTRAMUSCULAR
  Filled 2014-11-22: qty 1

## 2014-11-22 MED ORDER — DICYCLOMINE HCL 20 MG PO TABS: 20.0000 mg | ORAL_TABLET | Freq: Three times a day (TID) | ORAL | Status: DC | PRN

## 2014-11-22 MED ORDER — HYDROMORPHONE HCL 1 MG/ML IJ SOLN
1.0000 mg | Freq: Once | INTRAMUSCULAR | Status: AC
  Administered 2014-11-22: 1 mg via INTRAVENOUS
  Filled 2014-11-22: qty 1

## 2014-11-22 MED ORDER — ONDANSETRON 4 MG PO TBDP
ORAL_TABLET | ORAL | Status: AC
  Filled 2014-11-22: qty 1

## 2014-11-22 MED ORDER — METOCLOPRAMIDE HCL 5 MG PO TABS: 5.0000 mg | ORAL_TABLET | Freq: Three times a day (TID) | ORAL | Status: DC | PRN

## 2014-11-22 MED ORDER — ONDANSETRON HCL 4 MG/2ML IJ SOLN
4.0000 mg | Freq: Once | INTRAMUSCULAR | Status: DC
Start: 2014-11-22 — End: 2014-11-22
  Filled 2014-11-22: qty 2

## 2014-11-22 MED ORDER — HYDROCODONE-ACETAMINOPHEN 5-325 MG PO TABS: 1.0000 | ORAL_TABLET | Freq: Three times a day (TID) | ORAL | Status: DC | PRN

## 2014-11-22 MED ORDER — HYDROMORPHONE HCL 1 MG/ML IJ SOLN
1.0000 mg | Freq: Once | INTRAMUSCULAR | Status: AC
  Administered 2014-11-22: 1 mg via INTRAMUSCULAR

## 2014-11-22 MED ORDER — METOCLOPRAMIDE HCL 5 MG/ML IJ SOLN
10.0000 mg | Freq: Once | INTRAMUSCULAR | Status: AC
  Administered 2014-11-22: 10 mg via INTRAVENOUS
  Filled 2014-11-22: qty 2

## 2014-11-22 MED ORDER — ONDANSETRON 4 MG PO TBDP
4.0000 mg | ORAL_TABLET | Freq: Once | ORAL | Status: AC
  Administered 2014-11-22: 4 mg via ORAL

## 2014-11-22 MED ORDER — NITROFURANTOIN MONOHYD MACRO 100 MG PO CAPS: 100.0000 mg | ORAL_CAPSULE | Freq: Two times a day (BID) | ORAL | Status: DC

## 2014-11-22 MED ORDER — IOHEXOL 300 MG/ML  SOLN
100.0000 mL | Freq: Once | INTRAMUSCULAR | Status: AC | PRN
  Administered 2014-11-22: 100 mL via INTRAVENOUS
  Filled 2014-11-22: qty 100

## 2014-11-22 NOTE — ED Provider Notes (Signed)
[facility] Emergency Department Provider Note  ____________________________________________  Time seen: Approximately 12:29 PM  I have reviewed the triage vital signs and the nursing notes.   HISTORY  Chief Complaint Emesis and Diarrhea   HPI Holly Berry is a 29 y.o. female who presents with 1 month of cough and vomiting. She states over the last 1-2 weeks she has also developed daily watery brown diarrhea and frequent sweating and chills. She has had minimal appetite and has been vomiting any food or medication she is takes. She has been drinking lots of fluid to try and stay hydrated but has felt dizzy numerous times over the past month. Over the past week she has developed lower abdominal pain that worsens when she takes a deep breath. She also is complaining of R sided back pain near her shoulder blade, admits to having her gallbladder removed previously. She does not have a primary doctor and has not seen anyone for these symptoms over the past month. She denies any trauma prior to her symptom onset and states it seemed to start from no where.    Past Medical History  Diagnosis Date  . Ovarian cyst     There are no active problems to display for this patient.   Past Surgical History  Procedure Laterality Date  . Tubal ligation    . Cholecystectomy    . Knee surgery Right   . Hand surgery Right   . Foot surgery Left   . Cesarean section      Current Outpatient Rx  Name  Route  Sig  Dispense  Refill  . dicyclomine (BENTYL) 20 MG tablet   Oral   Take 1 tablet (20 mg total) by mouth 3 (three) times daily as needed for spasms.   30 tablet   0   . HYDROcodone-acetaminophen (NORCO) 5-325 MG tablet   Oral   Take 1 tablet by mouth every 8 (eight) hours as needed for moderate pain.   15 tablet   0   . metoCLOPramide (REGLAN) 5 MG tablet   Oral   Take 1 tablet (5 mg total) by mouth every 8 (eight) hours as needed for nausea or vomiting.  15 tablet   0   . naproxen (NAPROSYN) 500 MG tablet   Oral   Take 1 tablet (500 mg total) by mouth 2 (two) times daily with a meal.   60 tablet   0   . nitrofurantoin, macrocrystal-monohydrate, (MACROBID) 100 MG capsule   Oral   Take 1 capsule (100 mg total) by mouth 2 (two) times daily.   14 capsule   0   . sulfamethoxazole-trimethoprim (BACTRIM DS,SEPTRA DS) 800-160 MG per tablet   Oral   Take 1 tablet by mouth 2 (two) times daily.   20 tablet   0     Allergies Penicillins and Tramadol  No family history on file.  Social History Social History  Substance Use Topics  . Smoking status: Current Every Day Smoker  . Smokeless tobacco: None  . Alcohol Use: No    Review of Systems Constitutional: Positive chills and diaphoresis Eyes: No visual changes. ENT: Positive sore throat. Cardiovascular: Denies chest pain. Respiratory: Positive difficulty breathing. Gastrointestinal: Positive lower abdominal pain, nausea, vomiting and diarrhea.  Genitourinary: Negative for dysuria. Musculoskeletal: R back pain near shoulder-blade.  Skin: Negative for rash. Neurological: Positive for frequent dizziness.  10-point ROS otherwise negative.  ____________________________________________   PHYSICAL EXAM:  VITAL SIGNS: ED Triage Vitals  Enc  Vitals Group     BP 11/22/14 1141 144/77 mmHg     Pulse Rate 11/22/14 1141 77     Resp 11/22/14 1141 16     Temp 11/22/14 1141 98.6 F (37 C)     Temp Source 11/22/14 1141 Oral     SpO2 11/22/14 1141 96 %     Weight 11/22/14 1141 205 lb (92.987 kg)     Height 11/22/14 1141 5\' 8"  (1.727 m)     Head Cir --      Peak Flow --      Pain Score 11/22/14 1142 9     Pain Loc --      Pain Edu? --      Excl. in GC? --    Constitutional: Alert and oriented. In no acute distress. Eyes: Conjunctivae are normal. Head: Atraumatic. Nose: No congestion/rhinnorhea. Mouth/Throat: Mucous membranes are moist.  Neck: No cervical spine tenderness to  palpation. Hematological/Lymphatic/Immunilogical: No cervical lymphadenopathy. Cardiovascular: Normal rate, regular rhythm. Grossly normal heart sounds.  Good peripheral circulation. Respiratory: Normal respiratory effort.  No retractions. Lungs CTAB. Gastrointestinal: Soft with tenderness to both RUQ and RLQ to light and deep palpation. No rebound or guarding. Normoactive bowel sounds in all 4 quadrants. Positive Murphy's sign. Positive Psoas sign. Bilateral CVA tenderness, R>L. Musculoskeletal: No lower extremity tenderness nor edema.  No joint effusions. Neurologic:  Normal speech and language. No gross focal neurologic deficits are appreciated. No gait instability. Skin:  Skin is cool and clammy. No rash noted. Psychiatric: Mood and affect are normal. Speech and behavior are normal.  ____________________________________________   LABS (all labs ordered are listed, but only abnormal results are displayed)  Labs Reviewed  CBC - Abnormal; Notable for the following:    WBC 12.2 (*)    All other components within normal limits  URINALYSIS COMPLETEWITH MICROSCOPIC ([facility] ONLY) - Abnormal; Notable for the following:    Color, Urine YELLOW (*)    APPearance CLOUDY (*)    Leukocytes, UA 3+ (*)    Squamous Epithelial / LPF 6-30 (*)    All other components within normal limits  LIPASE, BLOOD  COMPREHENSIVE METABOLIC PANEL  POC URINE PREG, ED  POCT PREGNANCY, URINE   ____________________________________________  RADIOLOGY  No acute findings ____________________________________________   PROCEDURES  Procedure(s) performed: None  Critical Care performed: No  ____________________________________________   INITIAL IMPRESSION / ASSESSMENT AND PLAN / ED COURSE  Pertinent labs & imaging results that were available during my care of the patient were reviewed by me and considered in my medical decision making (see chart for details).  29 yo F who presents with 1 month of nausea,  vomiting and cough. She states over the past week to 2 weeks she has begun to have lower abdominal pain, R sided back pain and frequent brown watery diarrhea. She is now having diarrhea and vomiting daily. She is attempting to eat as much as possible but reports vomiting anything she eats; is drinking lots of fluids though. She endorses episodes of dizziness and light headedness during this time. Physical exam revealed significant R>L CVA tenderness, R sided abdominal pain with a positive Murphy's sign and Psoas sign. Pt is also diaphoretic and pale during exam. No tachycardia or fever were noted.   Blood work has been grossly normal other than a possible mild UTI. In attempting to establish an IV prior to CT abdomen and pelvis pt was stuck >7 times as providers have had a difficult time finding a vein. The  decision was finally made to discontinue the IV and have only oral contrast for the CT.  ____________________________________________   FINAL CLINICAL IMPRESSION(S) / ED DIAGNOSES  Final diagnoses:  Abdominal pain, acute, bilateral lower quadrant  Diarrhea, unspecified type  UTI (lower urinary tract infection)      Joni Reining, PA-C 11/23/14 2201  Jeanmarie Plant, MD 11/23/14 2214

## 2014-11-22 NOTE — ED Provider Notes (Signed)
[facility] Emergency Department Provider Note ____________________________________________  Time seen: Approximately 4:26 PM  I have reviewed the triage vital signs and the nursing notes.  HISTORY  Chief Complaint Emesis and Diarrhea  Vitals  Blood pressure 144/77, pulse 77, temperature 98.6 F (37 C), temperature source Oral, resp. rate 16, height 5\' 8"  (1.727 m), weight 205 lb (92.987 kg), last menstrual period 11/22/2014, SpO2 96 %.  Assuming care from R. Katrinka BlazingSmith, PA-C.  In short, Holly Berry is a 29 y.o. female with a chief complaint of Emesis and Diarrhea Refer to the original H&P for additional details.  The current plan of care is to await CT results and treat accordingly. ____________________________________________   LABS (all labs ordered are listed, but only abnormal results are displayed)  Labs Reviewed  CBC - Abnormal; Notable for the following:    WBC 12.2 (*)    All other components within normal limits  URINALYSIS COMPLETEWITH MICROSCOPIC ([facility] ONLY) - Abnormal; Notable for the following:    Color, Urine YELLOW (*)    APPearance CLOUDY (*)    Leukocytes, UA 3+ (*)    Squamous Epithelial / LPF 6-30 (*)    All other components within normal limits  LIPASE, BLOOD  COMPREHENSIVE METABOLIC PANEL  POC URINE PREG, ED  POCT PREGNANCY, URINE  ______________________________________________  RADIOLOGY  CT Abd/Pelvis w/ contrast IMPRESSION: Evidence of collapsing right ovarian cyst with peripheral wall enhancement.  Appendix appears normal. No bowel obstruction. No abscess. No renal or ureteral calculus. No hydronephrosis.  Gallbladder absent. Liver prominent without focal lesion.  Stable 4 mm nodular opacity left lower lobe. Stability of this nodular lesion is indicative of benign etiology.  Small ventral hernia containing only fat. ____________________________________________  PROCEDURES  Dilaudid 1 mg IVP Reglan 10  mg IVP ____________________________________________  INITIAL IMPRESSION / ASSESSMENT AND PLAN / ED COURSE  Pertinent labs & imaging results that were available during my care of the patient were reviewed by me and considered in my medical decision making (see chart for details).  Patient exam, lab, and CT results are reviewed. There is no clinical indication for her ongoing intermittent diarrhea or acute lower abdominal pain at this time. She may be experiencing IBS with diarrhea. She will be treated for a UTI based on urinalysis results. She will also be started on Bentyl for abdominal pain. Prescription for Reglan, Macrobid, and Vicodin are also provided. She is being referred to Dr. Servando SnareWohl for follow-up evaluation of any ongoing abdominal pain. Work note is provided as requested. ____________________________________________  FINAL CLINICAL IMPRESSION(S) / ED DIAGNOSES  Final diagnoses:  Abdominal pain, acute, bilateral lower quadrant  Diarrhea, unspecified type  UTI (lower urinary tract infection)      Lissa HoardJenise V Bacon Cylas Falzone, PA-C 11/22/14 2224  Minna AntisKevin Paduchowski, MD 11/22/14 2345

## 2014-11-22 NOTE — Discharge Instructions (Signed)
Abdominal Pain, Adult Many things can cause abdominal pain. Usually, abdominal pain is not caused by a disease and will improve without treatment. It can often be observed and treated at home. Your health care provider will do a physical exam and possibly order blood tests and X-rays to help determine the seriousness of your pain. However, in many cases, more time must pass before a clear cause of the pain can be found. Before that point, your health care provider may not know if you need more testing or further treatment. HOME CARE INSTRUCTIONS Monitor your abdominal pain for any changes. The following actions may help to alleviate any discomfort you are experiencing:  Only take over-the-counter or prescription medicines as directed by your health care provider.  Do not take laxatives unless directed to do so by your health care provider.  Try a clear liquid diet (broth, tea, or water) as directed by your health care provider. Slowly move to a bland diet as tolerated. SEEK MEDICAL CARE IF:  You have unexplained abdominal pain.  You have abdominal pain associated with nausea or diarrhea.  You have pain when you urinate or have a bowel movement.  You experience abdominal pain that wakes you in the night.  You have abdominal pain that is worsened or improved by eating food.  You have abdominal pain that is worsened with eating fatty foods.  You have a fever. SEEK IMMEDIATE MEDICAL CARE IF:  Your pain does not go away within 2 hours.  You keep throwing up (vomiting).  Your pain is felt only in portions of the abdomen, such as the right side or the left lower portion of the abdomen.  You pass bloody or black tarry stools. MAKE SURE YOU:  Understand these instructions.  Will watch your condition.  Will get help right away if you are not doing well or get worse.   This information is not intended to replace advice given to you by your health care provider. Make sure you discuss  any questions you have with your health care provider.   Document Released: 10/23/2004 Document Revised: 10/04/2014 Document Reviewed: 09/22/2012 Elsevier Interactive Patient Education 2016 [place].  Diarrhea Diarrhea is frequent loose and watery bowel movements. It can cause you to feel weak and dehydrated. Dehydration can cause you to become tired and thirsty, have a dry mouth, and have decreased urination that often is dark yellow. Diarrhea is a sign of another problem, most often an infection that will not last long. In most cases, diarrhea typically lasts 2-3 days. However, it can last longer if it is a sign of something more serious. It is important to treat your diarrhea as directed by your caregiver to lessen or prevent future episodes of diarrhea. CAUSES  Some common causes include:  Gastrointestinal infections caused by viruses, bacteria, or parasites.  Food poisoning or food allergies.  Certain medicines, such as antibiotics, chemotherapy, and laxatives.  Artificial sweeteners and fructose.  Digestive disorders. HOME CARE INSTRUCTIONS  Ensure adequate fluid intake (hydration): Have 1 cup (8 oz) of fluid for each diarrhea episode. Avoid fluids that contain simple sugars or sports drinks, fruit juices, whole milk products, and sodas. Your urine should be clear or pale yellow if you are drinking enough fluids. Hydrate with an oral rehydration solution that you can purchase at pharmacies, retail stores, and online. You can prepare an oral rehydration solution at home by mixing the following ingredients together:   - tsp table salt.   tsp baking soda.  tsp salt substitute containing potassium chloride.  1  tablespoons sugar.  1 L (34 oz) of water.  Certain foods and beverages may increase the speed at which food moves through the gastrointestinal (GI) tract. These foods and beverages should be avoided and include:  Caffeinated and alcoholic beverages.  High-fiber  foods, such as raw fruits and vegetables, nuts, seeds, and whole grain breads and cereals.  Foods and beverages sweetened with sugar alcohols, such as xylitol, sorbitol, and mannitol.  Some foods may be well tolerated and may help thicken stool including:  Starchy foods, such as rice, toast, pasta, low-sugar cereal, oatmeal, grits, baked potatoes, crackers, and bagels.  Bananas.  Applesauce.  Add probiotic-rich foods to help increase healthy bacteria in the GI tract, such as yogurt and fermented milk products.  Wash your hands well after each diarrhea episode.  Only take over-the-counter or prescription medicines as directed by your caregiver.  Take a warm bath to relieve any burning or pain from frequent diarrhea episodes. SEEK IMMEDIATE MEDICAL CARE IF:   You are unable to keep fluids down.  You have persistent vomiting.  You have blood in your stool, or your stools are black and tarry.  You do not urinate in 6-8 hours, or there is only a small amount of very dark urine.  You have abdominal pain that increases or localizes.  You have weakness, dizziness, confusion, or light-headedness.  You have a severe headache.  Your diarrhea gets worse or does not get better.  You have a fever or persistent symptoms for more than 2-3 days.  You have a fever and your symptoms suddenly get worse. MAKE SURE YOU:   Understand these instructions.  Will watch your condition.  Will get help right away if you are not doing well or get worse.   This information is not intended to replace advice given to you by your health care provider. Make sure you discuss any questions you have with your health care provider.   Document Released: 01/03/2002 Document Revised: 02/03/2014 Document Reviewed: 09/21/2011 Elsevier Interactive Patient Education 2016 [employer]  Food Choices to Help Relieve Diarrhea, Adult When you have diarrhea, the foods you eat and your eating habits are very  important. Choosing the right foods and drinks can help relieve diarrhea. Also, because diarrhea can last up to 7 days, you need to replace lost fluids and electrolytes (such as sodium, potassium, and chloride) in order to help prevent dehydration.  WHAT GENERAL GUIDELINES DO I NEED TO FOLLOW?  Slowly drink 1 cup (8 oz) of fluid for each episode of diarrhea. If you are getting enough fluid, your urine will be clear or pale yellow.  Eat starchy foods. Some good choices include white rice, white toast, pasta, low-fiber cereal, baked potatoes (without the skin), saltine crackers, and bagels.  Avoid large servings of any cooked vegetables.  Limit fruit to two servings per day. A serving is  cup or 1 small piece.  Choose foods with less than 2 g of fiber per serving.  Limit fats to less than 8 tsp (38 g) per day.  Avoid fried foods.  Eat foods that have probiotics in them. Probiotics can be found in certain dairy products.  Avoid foods and beverages that may increase the speed at which food moves through the stomach and intestines (gastrointestinal tract). Things to avoid include:  High-fiber foods, such as dried fruit, raw fruits and vegetables, nuts, seeds, and whole grain foods.  Spicy foods and high-fat foods.  Foods and beverages sweetened with high-fructose corn syrup, honey, or sugar alcohols such as xylitol, sorbitol, and mannitol. WHAT FOODS ARE RECOMMENDED? Grains White rice. White, [place], or pita breads (fresh or toasted), including plain rolls, buns, or bagels. White pasta. Saltine, soda, or graham crackers. Pretzels. Low-fiber cereal. Cooked cereals made with water (such as cornmeal, farina, or cream cereals). Plain muffins. Matzo. Melba toast. Zwieback.  Vegetables Potatoes (without the skin). Strained tomato and vegetable juices. Most well-cooked and canned vegetables without seeds. Tender lettuce. Fruits Cooked or canned applesauce, apricots, cherries, fruit cocktail,  grapefruit, peaches, pears, or plums. Fresh bananas, apples without skin, cherries, grapes, cantaloupe, grapefruit, peaches, oranges, or plums.  Meat and Other Protein Products Baked or boiled chicken. Eggs. Tofu. Fish. Seafood. Smooth peanut butter. Ground or well-cooked tender beef, ham, veal, lamb, pork, or poultry.  Dairy Plain yogurt, kefir, and unsweetened liquid yogurt. Lactose-free milk, buttermilk, or soy milk. Plain hard cheese. Beverages Sport drinks. Clear broths. Diluted fruit juices (except prune). Regular, caffeine-free sodas such as ginger ale. Water. Decaffeinated teas. Oral rehydration solutions. Sugar-free beverages not sweetened with sugar alcohols. Other Bouillon, broth, or soups made from recommended foods.  The items listed above may not be a complete list of recommended foods or beverages. Contact your dietitian for more options. WHAT FOODS ARE NOT RECOMMENDED? Grains Whole grain, whole wheat, bran, or rye breads, rolls, pastas, crackers, and cereals. Wild or brown rice. Cereals that contain more than 2 g of fiber per serving. Corn tortillas or taco shells. Cooked or dry oatmeal. Granola. Popcorn. Vegetables Raw vegetables. Cabbage, broccoli, Brussels sprouts, artichokes, baked beans, beet greens, corn, kale, legumes, peas, sweet potatoes, and yams. Potato skins. Cooked spinach and cabbage. Fruits Dried fruit, including raisins and dates. Raw fruits. Stewed or dried prunes. Fresh apples with skin, apricots, mangoes, pears, raspberries, and strawberries.  Meat and Other Protein Products Chunky peanut butter. Nuts and seeds. Beans and lentils. Berniece Salines.  Dairy High-fat cheeses. Milk, chocolate milk, and beverages made with milk, such as milk shakes. Cream. Ice cream. Sweets and Desserts Sweet rolls, doughnuts, and sweet breads. Pancakes and waffles. Fats and Oils Butter. Cream sauces. Margarine. Salad oils. Plain salad dressings. Olives. Avocados.  Beverages Caffeinated  beverages (such as coffee, tea, soda, or energy drinks). Alcoholic beverages. Fruit juices with pulp. Prune juice. Soft drinks sweetened with high-fructose corn syrup or sugar alcohols. Other Coconut. Hot sauce. Chili powder. Mayonnaise. Gravy. Cream-based or milk-based soups.  The items listed above may not be a complete list of foods and beverages to avoid. Contact your dietitian for more information. WHAT SHOULD I DO IF I BECOME DEHYDRATED? Diarrhea can sometimes lead to dehydration. Signs of dehydration include dark urine and dry mouth and skin. If you think you are dehydrated, you should rehydrate with an oral rehydration solution. These solutions can be purchased at pharmacies, retail stores, or online.  Drink -1 cup (120-240 mL) of oral rehydration solution each time you have an episode of diarrhea. If drinking this amount makes your diarrhea worse, try drinking smaller amounts more often. For example, drink 1-3 tsp (5-15 mL) every 5-10 minutes.  A general rule for staying hydrated is to drink 1-2 L of fluid per day. Talk to your health care provider about the specific amount you should be drinking each day. Drink enough fluids to keep your urine clear or pale yellow.   This information is not intended to replace advice given to you by your health care provider. Make sure you discuss  any questions you have with your health care provider.   Document Released: 04/05/2003 Document Revised: 02/03/2014 Document Reviewed: 12/06/2012 Elsevier Interactive Patient Education 2016 [employer] Inc[employer]  Urinary Tract Infection A urinary tract infection (UTI) can occur any place along the urinary tract. The tract includes the kidneys, ureters, bladder, and urethra. A type of germ called bacteria often causes a UTI. UTIs are often helped with antibiotic medicine.  HOME CARE   If given, take antibiotics as told by your doctor. Finish them even if you start to feel better.  Drink enough fluids to keep your  pee (urine) clear or pale yellow.  Avoid tea, drinks with caffeine, and bubbly (carbonated) drinks.  Pee often. Avoid holding your pee in for a long time.  Pee before and after having sex (intercourse).  Wipe from front to back after you poop (bowel movement) if you are a woman. Use each tissue only once. GET HELP RIGHT AWAY IF:   You have back pain.  You have lower belly (abdominal) pain.  You have chills.  You feel sick to your stomach (nauseous).  You throw up (vomit).  Your burning or discomfort with peeing does not go away.  You have a fever.  Your symptoms are not better in 3 days. MAKE SURE YOU:   Understand these instructions.  Will watch your condition.  Will get help right away if you are not doing well or get worse.   This information is not intended to replace advice given to you by your health care provider. Make sure you discuss any questions you have with your health care provider.   Document Released: 07/02/2007 Document Revised: 02/03/2014 Document Reviewed: 08/14/2011 Elsevier Interactive Patient Education [employer]   Take the prescription meds as directed. Increase fluid intake and eat foods to prevent diarrhea.  Follow-up with Dr. Servando Snare for further evaluation.  Return for acutely worsening symptoms.

## 2014-11-22 NOTE — ED Notes (Signed)
IV attempts X 7, with no more than 2 per RN attempts, unable to obtain access.

## 2014-11-22 NOTE — ED Notes (Signed)
Pt reports cough, vomiting, diarrhea for the past month. Pt also reports dark urine with "stuff floating around in it".

## 2015-01-05 ENCOUNTER — Other Ambulatory Visit: Payer: Self-pay

## 2015-01-15 ENCOUNTER — Ambulatory Visit: Payer: Self-pay | Admitting: Gastroenterology

## 2015-01-15 ENCOUNTER — Telehealth: Payer: Self-pay | Admitting: General Surgery

## 2015-01-15 NOTE — Telephone Encounter (Signed)
Patient was a no show for appointment today. Left a voice message for patient to call and reschedule

## 2015-02-10 ENCOUNTER — Encounter: Payer: Self-pay | Admitting: Emergency Medicine

## 2015-02-10 ENCOUNTER — Emergency Department
Admission: EM | Admit: 2015-02-10 | Discharge: 2015-02-10 | Disposition: A | Discharge status: Home / Self Care | Payer: Medicaid Other | Attending: Emergency Medicine | Admitting: Emergency Medicine

## 2015-02-10 DIAGNOSIS — Z791 Long term (current) use of non-steroidal anti-inflammatories (NSAID): Secondary | ICD-10-CM | POA: Insufficient documentation

## 2015-02-10 DIAGNOSIS — R112 Nausea with vomiting, unspecified: Secondary | ICD-10-CM | POA: Diagnosis not present

## 2015-02-10 DIAGNOSIS — Z792 Long term (current) use of antibiotics: Secondary | ICD-10-CM | POA: Insufficient documentation

## 2015-02-10 DIAGNOSIS — F17200 Nicotine dependence, unspecified, uncomplicated: Secondary | ICD-10-CM | POA: Insufficient documentation

## 2015-02-10 DIAGNOSIS — R1084 Generalized abdominal pain: Secondary | ICD-10-CM | POA: Diagnosis not present

## 2015-02-10 DIAGNOSIS — R252 Cramp and spasm: Secondary | ICD-10-CM | POA: Insufficient documentation

## 2015-02-10 DIAGNOSIS — Z79899 Other long term (current) drug therapy: Secondary | ICD-10-CM | POA: Insufficient documentation

## 2015-02-10 DIAGNOSIS — Z3202 Encounter for pregnancy test, result negative: Secondary | ICD-10-CM | POA: Insufficient documentation

## 2015-02-10 DIAGNOSIS — Z880 Allergy status to penicillin: Secondary | ICD-10-CM | POA: Diagnosis not present

## 2015-02-10 LAB — URINALYSIS COMPLETE WITH MICROSCOPIC (ARMC ONLY)
Bilirubin Urine: NEGATIVE
Glucose, UA: NEGATIVE mg/dL
Hgb urine dipstick: NEGATIVE
KETONES UR: NEGATIVE mg/dL
Nitrite: NEGATIVE
PH: 6 (ref 5.0–8.0)
PROTEIN: NEGATIVE mg/dL
SPECIFIC GRAVITY, URINE: 1.018 (ref 1.005–1.030)

## 2015-02-10 LAB — HCG, QUANTITATIVE, PREGNANCY

## 2015-02-10 LAB — COMPREHENSIVE METABOLIC PANEL
ALT: 128 U/L — ABNORMAL HIGH (ref 14–54)
ANION GAP: 7 (ref 5–15)
AST: 83 U/L — ABNORMAL HIGH (ref 15–41)
Albumin: 4.2 g/dL (ref 3.5–5.0)
Alkaline Phosphatase: 100 U/L (ref 38–126)
BILIRUBIN TOTAL: 0.2 mg/dL — AB (ref 0.3–1.2)
BUN: 14 mg/dL (ref 6–20)
CO2: 24 mmol/L (ref 22–32)
Calcium: 10 mg/dL (ref 8.9–10.3)
Chloride: 106 mmol/L (ref 101–111)
Creatinine, Ser: 0.75 mg/dL (ref 0.44–1.00)
Glucose, Bld: 87 mg/dL (ref 65–99)
POTASSIUM: 4 mmol/L (ref 3.5–5.1)
Sodium: 137 mmol/L (ref 135–145)
TOTAL PROTEIN: 7.5 g/dL (ref 6.5–8.1)

## 2015-02-10 LAB — CBC WITH DIFFERENTIAL/PLATELET
BASOS ABS: 0.1 10*3/uL (ref 0–0.1)
Basophils Relative: 1 %
EOS PCT: 2 %
Eosinophils Absolute: 0.2 10*3/uL (ref 0–0.7)
HCT: 41.5 % (ref 35.0–47.0)
HEMOGLOBIN: 13.8 g/dL (ref 12.0–16.0)
LYMPHS ABS: 3.3 10*3/uL (ref 1.0–3.6)
LYMPHS PCT: 31 %
MCH: 27.9 pg (ref 26.0–34.0)
MCHC: 33.3 g/dL (ref 32.0–36.0)
MCV: 83.7 fL (ref 80.0–100.0)
Monocytes Absolute: 0.8 10*3/uL (ref 0.2–0.9)
Monocytes Relative: 7 %
NEUTROS ABS: 6.4 10*3/uL (ref 1.4–6.5)
NEUTROS PCT: 59 %
PLATELETS: 269 10*3/uL (ref 150–440)
RBC: 4.95 MIL/uL (ref 3.80–5.20)
RDW: 14.4 % (ref 11.5–14.5)
WBC: 10.8 10*3/uL (ref 3.6–11.0)

## 2015-02-10 LAB — POCT PREGNANCY, URINE: Preg Test, Ur: NEGATIVE

## 2015-02-10 MED ORDER — DICYCLOMINE HCL 20 MG PO TABS: 20.0000 mg | ORAL_TABLET | Freq: Three times a day (TID) | ORAL | Status: DC | PRN

## 2015-02-10 MED ORDER — RANITIDINE HCL 150 MG PO CAPS: 150.0000 mg | ORAL_CAPSULE | Freq: Two times a day (BID) | ORAL | Status: DC

## 2015-02-10 MED ORDER — PROMETHAZINE HCL 25 MG PO TABS: 25.0000 mg | ORAL_TABLET | Freq: Four times a day (QID) | ORAL | Status: DC | PRN

## 2015-02-10 NOTE — ED Notes (Signed)
Reports LMP was12/01/16.  States she has been having abd cramping.  States she  Had a tubal ligation but had a positive preg test today.

## 2015-02-10 NOTE — Discharge Instructions (Signed)
Abdominal Pain, Adult °Many things can cause abdominal pain. Usually, abdominal pain is not caused by a disease and will improve without treatment. It can often be observed and treated at home. Your health care provider will do a physical exam and possibly order blood tests and X-rays to help determine the seriousness of your pain. However, in many cases, more time must pass before a clear cause of the pain can be found. Before that point, your health care provider may not know if you need more testing or further treatment. °HOME CARE INSTRUCTIONS °Monitor your abdominal pain for any changes. The following actions may help to alleviate any discomfort you are experiencing: °· Only take over-the-counter or prescription medicines as directed by your health care provider. °· Do not take laxatives unless directed to do so by your health care provider. °· Try a clear liquid diet (broth, tea, or water) as directed by your health care provider. Slowly move to a bland diet as tolerated. °SEEK MEDICAL CARE IF: °· You have unexplained abdominal pain. °· You have abdominal pain associated with nausea or diarrhea. °· You have pain when you urinate or have a bowel movement. °· You experience abdominal pain that wakes you in the night. °· You have abdominal pain that is worsened or improved by eating food. °· You have abdominal pain that is worsened with eating fatty foods. °· You have a fever. °SEEK IMMEDIATE MEDICAL CARE IF: °· Your pain does not go away within 2 hours. °· You keep throwing up (vomiting). °· Your pain is felt only in portions of the abdomen, such as the right side or the left lower portion of the abdomen. °· You pass bloody or black tarry stools. °MAKE SURE YOU: °· Understand these instructions. °· Will watch your condition. °· Will get help right away if you are not doing well or get worse. °  °This information is not intended to replace advice given to you by your health care provider. Make sure you discuss  any questions you have with your health care provider. °  °Document Released: 10/23/2004 Document Revised: 10/04/2014 Document Reviewed: 09/22/2012 °Elsevier Interactive Patient Education ©2016 [employer] Inc[employer] ° °Nausea and Vomiting °Nausea means you feel sick to your stomach. Throwing up (vomiting) is a reflex where stomach contents come out of your mouth. °HOME CARE  °· Take medicine as told by your doctor. °· Do not force yourself to eat. However, you do need to drink fluids. °· If you feel like eating, eat a normal diet as told by your doctor. °¨ Eat rice, wheat, potatoes, bread, lean meats, yogurt, fruits, and vegetables. °¨ Avoid high-fat foods. °· Drink enough fluids to keep your pee (urine) clear or pale yellow. °· Ask your doctor how to replace body fluid losses (rehydrate). Signs of body fluid loss (dehydration) include: °¨ Feeling very thirsty. °¨ Dry lips and mouth. °¨ Feeling dizzy. °¨ Dark pee. °¨ Peeing less than normal. °¨ Feeling confused. °¨ Fast breathing or heart rate. °GET HELP RIGHT AWAY IF:  °· You have blood in your throw up. °· You have black or bloody poop (stool). °· You have a bad headache or stiff neck. °· You feel confused. °· You have bad belly (abdominal) pain. °· You have chest pain or trouble breathing. °· You do not pee at least once every 8 hours. °· You have cold, clammy skin. °· You keep throwing up after 24 to 48 hours. °· You have a fever. °MAKE SURE YOU:  °·   Understand these instructions. °· Will watch your condition. °· Will get help right away if you are not doing well or get worse. °  °This information is not intended to replace advice given to you by your health care provider. Make sure you discuss any questions you have with your health care provider. °  °Document Released: 07/02/2007 Document Revised: 04/07/2011 Document Reviewed: 06/14/2010 °Elsevier Interactive Patient Education ©2016 [employer] Inc. ° °

## 2015-02-10 NOTE — ED Provider Notes (Signed)
[facility] Emergency Department Provider Note  ____________________________________________  Time seen: 7:00 PM  I have reviewed the triage vital signs and the nursing notes.   HISTORY  Chief Complaint Abdominal Cramping    HPI Holly Berry is a 30 y.o. female who complains of generalized abdominal cramping pain. Also has nausea and vomiting but no diarrhea. Normal bowel movement today. No fever or chills. No chest pain shortness of breath headaches or dizziness. No dysuria frequency urgency. She took a home pregnancy test and thought that it was faintly positive. She is status post tubal ligation.     Past Medical History  Diagnosis Date  . Ovarian cyst      Patient Active Problem List   Diagnosis Date Noted  . Postoperative state 09/17/2012  . History of cesarean section 09/17/2012  . Premature labor 09/09/2012  . Normal pregnancy, incidental 09/09/2012     Past Surgical History  Procedure Laterality Date  . Tubal ligation    . Cholecystectomy    . Knee surgery Right   . Hand surgery Right   . Foot surgery Left   . Cesarean section    . Tubal ligation       Current Outpatient Rx  Name  Route  Sig  Dispense  Refill  . acetaminophen (TYLENOL) 500 MG tablet   Oral   Take by mouth.         . dicyclomine (BENTYL) 20 MG tablet   Oral   Take 1 tablet (20 mg total) by mouth 3 (three) times daily as needed for spasms.   30 tablet   0   . HYDROcodone-acetaminophen (NORCO) 5-325 MG tablet   Oral   Take 1 tablet by mouth every 8 (eight) hours as needed for moderate pain.   15 tablet   0   . metoCLOPramide (REGLAN) 5 MG tablet   Oral   Take 1 tablet (5 mg total) by mouth every 8 (eight) hours as needed for nausea or vomiting.   15 tablet   0   . naproxen (NAPROSYN) 500 MG tablet   Oral   Take 1 tablet (500 mg total) by mouth 2 (two) times daily with a meal.   60 tablet   0   . nitrofurantoin, macrocrystal-monohydrate,  (MACROBID) 100 MG capsule   Oral   Take 1 capsule (100 mg total) by mouth 2 (two) times daily.   14 capsule   0   . Prenatal MV-Min-Fe Fum-FA-DHA (PRENATAL 1 PO)   Oral   Take by mouth.         . promethazine (PHENERGAN) 25 MG tablet   Oral   Take 1 tablet (25 mg total) by mouth every 6 (six) hours as needed for nausea or vomiting.   15 tablet   0   . ranitidine (ZANTAC) 150 MG capsule   Oral   Take 1 capsule (150 mg total) by mouth 2 (two) times daily.   28 capsule   0   . sulfamethoxazole-trimethoprim (BACTRIM DS,SEPTRA DS) 800-160 MG per tablet   Oral   Take 1 tablet by mouth 2 (two) times daily.   20 tablet   0      Allergies Penicillins and Tramadol   History reviewed. No pertinent family history.  Social History Social History  Substance Use Topics  . Smoking status: Current Every Day Smoker  . Smokeless tobacco: None  . Alcohol Use: No    Review of Systems  Constitutional:   No  fever or chills. No weight changes Eyes:   No blurry vision or double vision.  ENT:   No sore throat. Cardiovascular:   No chest pain. Respiratory:   No dyspnea or cough. Gastrointestinal:   Positive abdominal pain with vomiting, no diarrhea..  No BRBPR or melena. Genitourinary:   Negative for dysuria, urinary retention, bloody urine, or difficulty urinating. No vaginal discharge or bleeding. Not sexually active Musculoskeletal:   Negative for back pain. No joint swelling or pain. Skin:   Negative for rash. Neurological:   Negative for headaches, focal weakness or numbness. Psychiatric:  No anxiety or depression.   Endocrine:  No hot/cold intolerance, changes in energy, or sleep difficulty.  10-point ROS otherwise negative.  ____________________________________________   PHYSICAL EXAM:  VITAL SIGNS: ED Triage Vitals  Enc Vitals Group     BP 02/10/15 1727 133/81 mmHg     Pulse Rate 02/10/15 1727 97     Resp 02/10/15 1727 18     Temp 02/10/15 1727 98 F (36.7  C)     Temp Source 02/10/15 1727 Oral     SpO2 02/10/15 1727 99 %     Weight 02/10/15 1727 200 lb (90.719 kg)     Height 02/10/15 1727 5\' 7"  (1.702 m)     Head Cir --      Peak Flow --      Pain Score 02/10/15 2007 8     Pain Loc --      Pain Edu? --      Excl. in GC? --     Vital signs reviewed, nursing assessments reviewed.   Constitutional:   Alert and oriented. Well appearing and in no distress. Eyes:   No scleral icterus. No conjunctival pallor. PERRL. EOMI ENT   Head:   Normocephalic and atraumatic.   Nose:   No congestion/rhinnorhea. No septal hematoma   Mouth/Throat:   MMM, no pharyngeal erythema. No peritonsillar mass. No uvula shift.   Neck:   No stridor. No SubQ emphysema. No meningismus. Hematological/Lymphatic/Immunilogical:   No cervical lymphadenopathy. Cardiovascular:   RRR. Normal and symmetric distal pulses are present in all extremities. No murmurs, rubs, or gallops. Respiratory:   Normal respiratory effort without tachypnea nor retractions. Breath sounds are clear and equal bilaterally. No wheezes/rales/rhonchi. Gastrointestinal:   Soft with generalized mild tenderness. No focal findings.. No distention. There is no CVA tenderness.  No rebound, rigidity, or guarding. Genitourinary:   deferred Musculoskeletal:   Nontender with normal range of motion in all extremities. No joint effusions.  No lower extremity tenderness.  No edema. Neurologic:   Normal speech and language.  CN 2-10 normal. Motor grossly intact. No pronator drift.  Normal gait. No gross focal neurologic deficits are appreciated.  Skin:    Skin is warm, dry and intact. No rash noted.  No petechiae, purpura, or bullae. Psychiatric:   Mood and affect are normal. Speech and behavior are normal. Patient exhibits appropriate insight and judgment.  ____________________________________________    LABS (pertinent positives/negatives) (all labs ordered are listed, but only abnormal  results are displayed) Labs Reviewed  COMPREHENSIVE METABOLIC PANEL - Abnormal; Notable for the following:    AST 83 (*)    ALT 128 (*)    Total Bilirubin 0.2 (*)    All other components within normal limits  URINALYSIS COMPLETEWITH MICROSCOPIC ([facility] ONLY) - Abnormal; Notable for the following:    Color, Urine YELLOW (*)    APPearance CLOUDY (*)    Leukocytes, UA 3+ (*)  Bacteria, UA RARE (*)    Squamous Epithelial / LPF 6-30 (*)    All other components within normal limits  URINE CULTURE  CBC WITH DIFFERENTIAL/PLATELET  HCG, QUANTITATIVE, PREGNANCY  POC URINE PREG, ED  POCT PREGNANCY, URINE   ____________________________________________   EKG    ____________________________________________    RADIOLOGY    ____________________________________________   PROCEDURES   ____________________________________________   INITIAL IMPRESSION / ASSESSMENT AND PLAN / ED COURSE  Pertinent labs & imaging results that were available during my care of the patient were reviewed by me and considered in my medical decision making (see chart for details).  Patient presents with generalized abdominal pain. Overall well-appearing no acute distress. Exam is nonfocal. Vital signs are normal. She states that this feels somewhat like her typical ovarian cyst pain although it's been constant for 3 or 4 days. Low suspicion for torsion. Workup reveals only a slight elevation of her transaminases consistent with viral illness. This explains her combination of complaints and exam findings.Considering the patient's symptoms, medical history, and physical examination today, I have low suspicion for cholecystitis or biliary pathology, pancreatitis, perforation or bowel obstruction, hernia, intra-abdominal abscess, AAA or dissection, volvulus or intussusception, or appendicitis. Low suspicion for torsion STI TOA PID. No ectopic. Urinalysis is somewhat equivocal and since her symptoms are not consistent  with cystitis, all sent for urine culture but hold off on antibiotics for now.       ____________________________________________   FINAL CLINICAL IMPRESSION(S) / ED DIAGNOSES  Final diagnoses:  Generalized abdominal pain  Non-intractable vomiting with nausea, vomiting of unspecified type      Sharman CheekPhillip Marshay Slates, MD 02/10/15 2029

## 2015-02-12 LAB — URINE CULTURE

## 2015-11-04 ENCOUNTER — Emergency Department
Admission: EM | Admit: 2015-11-04 | Discharge: 2015-11-04 | Disposition: A | Discharge status: Home / Self Care | Payer: Medicaid Other | Attending: Emergency Medicine | Admitting: Emergency Medicine

## 2015-11-04 ENCOUNTER — Encounter: Payer: Self-pay | Admitting: Emergency Medicine

## 2015-11-04 ENCOUNTER — Emergency Department: Payer: Medicaid Other

## 2015-11-04 DIAGNOSIS — R1032 Left lower quadrant pain: Secondary | ICD-10-CM | POA: Insufficient documentation

## 2015-11-04 DIAGNOSIS — R102 Pelvic and perineal pain: Secondary | ICD-10-CM

## 2015-11-04 DIAGNOSIS — R1031 Right lower quadrant pain: Secondary | ICD-10-CM | POA: Diagnosis not present

## 2015-11-04 DIAGNOSIS — Z79899 Other long term (current) drug therapy: Secondary | ICD-10-CM | POA: Insufficient documentation

## 2015-11-04 DIAGNOSIS — F17200 Nicotine dependence, unspecified, uncomplicated: Secondary | ICD-10-CM | POA: Insufficient documentation

## 2015-11-04 LAB — COMPREHENSIVE METABOLIC PANEL
ALBUMIN: 4.6 g/dL (ref 3.5–5.0)
ALT: 21 U/L (ref 14–54)
ANION GAP: 9 (ref 5–15)
AST: 23 U/L (ref 15–41)
Alkaline Phosphatase: 83 U/L (ref 38–126)
BUN: 15 mg/dL (ref 6–20)
CO2: 23 mmol/L (ref 22–32)
Calcium: 9.4 mg/dL (ref 8.9–10.3)
Chloride: 105 mmol/L (ref 101–111)
Creatinine, Ser: 0.71 mg/dL (ref 0.44–1.00)
GFR calc Af Amer: >60 mL/min (ref >60)
GFR calc non Af Amer: >60 mL/min (ref >60)
GLUCOSE: 96 mg/dL (ref 65–99)
POTASSIUM: 3.7 mmol/L (ref 3.5–5.1)
SODIUM: 137 mmol/L (ref 135–145)
Total Bilirubin: 0.7 mg/dL (ref 0.3–1.2)
Total Protein: 8.3 g/dL — ABNORMAL HIGH (ref 6.5–8.1)

## 2015-11-04 LAB — CBC
HEMATOCRIT: 42.6 % (ref 35.0–47.0)
HEMOGLOBIN: 13.9 g/dL (ref 12.0–16.0)
MCH: 26.3 pg (ref 26.0–34.0)
MCHC: 32.6 g/dL (ref 32.0–36.0)
MCV: 80.8 fL (ref 80.0–100.0)
Platelets: 307 10*3/uL (ref 150–440)
RBC: 5.27 MIL/uL — ABNORMAL HIGH (ref 3.80–5.20)
RDW: 14.7 % — ABNORMAL HIGH (ref 11.5–14.5)
WBC: 12.6 10*3/uL — ABNORMAL HIGH (ref 3.6–11.0)

## 2015-11-04 LAB — URINALYSIS COMPLETE WITH MICROSCOPIC (ARMC ONLY)
BACTERIA UA: NONE SEEN
Bilirubin Urine: NEGATIVE
Glucose, UA: NEGATIVE mg/dL
Hgb urine dipstick: NEGATIVE
Ketones, ur: NEGATIVE mg/dL
NITRITE: NEGATIVE
PROTEIN: NEGATIVE mg/dL
SPECIFIC GRAVITY, URINE: 1.024 (ref 1.005–1.030)
pH: 6 (ref 5.0–8.0)

## 2015-11-04 LAB — POCT PREGNANCY, URINE: PREG TEST UR: NEGATIVE

## 2015-11-04 LAB — LIPASE, BLOOD: Lipase: 28 U/L (ref 11–51)

## 2015-11-04 MED ORDER — HYDROCODONE-ACETAMINOPHEN 5-325 MG PO TABS: 1.0000 | ORAL_TABLET | Freq: Four times a day (QID) | ORAL | 0 refills | Status: DC | PRN

## 2015-11-04 MED ORDER — KETOROLAC TROMETHAMINE 30 MG/ML IJ SOLN
15.0000 mg | Freq: Once | INTRAMUSCULAR | Status: AC
Start: 2015-11-04 — End: 2015-11-04
  Administered 2015-11-04: 15 mg via INTRAVENOUS
  Filled 2015-11-04: qty 1

## 2015-11-04 MED ORDER — ONDANSETRON HCL 4 MG/2ML IJ SOLN
4.0000 mg | Freq: Once | INTRAMUSCULAR | Status: AC
Start: 2015-11-04 — End: 2015-11-04
  Administered 2015-11-04: 4 mg via INTRAVENOUS
  Filled 2015-11-04: qty 2

## 2015-11-04 MED ORDER — PROMETHAZINE HCL 12.5 MG PO TABS
12.5000 mg | ORAL_TABLET | Freq: Four times a day (QID) | ORAL | 0 refills | Status: DC | PRN
Start: 2015-11-04 — End: 2018-08-18

## 2015-11-04 NOTE — ED Notes (Signed)
Patient expresses concerns regarding ectopic pregnancy. Patient educated that her pregnancy test was negative.

## 2015-11-04 NOTE — Discharge Instructions (Signed)
Please take over-the-counter ibuprofen around-the-clock and drink plenty of fluids. Return to the emergency Department for persistent vomiting, bloody stool, fever, or any new concerns.  Please return immediately if condition worsens. Please contact her primary physician or the physician you were given for referral. If you have any specialist physicians involved in her treatment and plan please also contact them. Thank you for using [facility] emergency [facility].

## 2015-11-04 NOTE — ED Notes (Signed)
UA Preg = NEG 

## 2015-11-04 NOTE — ED Provider Notes (Signed)
Time Seen: Approximately 1319  I have reviewed the triage notes  Chief Complaint: Abdominal Pain   History of Present Illness: Holly Berry is a 30 y.o. female who presents with some bilateral lower abdominal pain over the past week. She states the pain comes and goes and seems to be worse with time some nausea. She denies any upper abdominal pain. She denies any loose stool or diarrhea. No dysuria or hematuria. She denies any risk of being pregnant.   Past Medical History:  Diagnosis Date  . Ovarian cyst     Patient Active Problem List   Diagnosis Date Noted  . Postoperative state 09/17/2012  . History of cesarean section 09/17/2012  . Premature labor 09/09/2012  . Normal pregnancy, incidental 09/09/2012    Past Surgical History:  Procedure Laterality Date  . CESAREAN SECTION    . CHOLECYSTECTOMY    . FOOT SURGERY Left   . HAND SURGERY Right   . KNEE SURGERY Right   . TUBAL LIGATION    . TUBAL LIGATION      Past Surgical History:  Procedure Laterality Date  . CESAREAN SECTION    . CHOLECYSTECTOMY    . FOOT SURGERY Left   . HAND SURGERY Right   . KNEE SURGERY Right   . TUBAL LIGATION    . TUBAL LIGATION      Current Outpatient Rx  . Order #: [id number] Class: Historical Med  . Order #: [id number] Class: Print  . Order #: [id number] Class: Print  . Order #: [id number] Class: Print  . Order #: [id number] Class: Print  . Order #: [id number] Class: Historical Med  . Order #: [id number] Class: Print  . Order #: [id number] Class: Print  . Order #: [id number] Class: Print    Allergies:  Penicillins and Tramadol  Family History: History reviewed. No pertinent family history.  Social History: Social History  Substance Use Topics  . Smoking status: Current Every Day Smoker  . Smokeless tobacco: Never Used  . Alcohol use No     Review of Systems:   10 point review of systems was performed and was otherwise negative:  Constitutional: No fever Eyes: No visual  disturbances ENT: No sore throat, ear pain Cardiac: No chest pain Respiratory: No shortness of breath, wheezing, or stridor Abdomen: Patient points to the lower right side of the abdomen with no left-sided pain or flank discomfort. Endocrine: No weight loss, No night sweats Extremities: No peripheral edema, cyanosis Skin: No rashes, easy bruising Neurologic: No focal weakness, trouble with speech or swollowing Urologic: No dysuria, Hematuria, or urinary frequency She denies any abnormal vaginal discharge or bleeding.  Physical Exam:  ED Triage Vitals  Enc Vitals Group     BP 11/04/15 1158 (!) 123/99     Pulse Rate 11/04/15 1158 83     Resp 11/04/15 1158 18     Temp 11/04/15 1158 98.2 F (36.8 C)     Temp Source 11/04/15 1158 Oral     SpO2 11/04/15 1158 98 %     Weight 11/04/15 1159 210 lb (95.3 kg)     Height 11/04/15 1159 5\' 7"  (1.702 m)     Head Circumference --      Peak Flow --      Pain Score 11/04/15 1159 9     Pain Loc --      Pain Edu? --      Excl. in GC? --     General: Awake , Alert , and Oriented times 3; GCS 15 Patient is  sitting upright and comfortable with her legs crossed underneath her in the stretcher. Head: Normal cephalic , atraumatic Eyes: Pupils equal , round, reactive to light Nose/Throat: No nasal drainage, patent upper airway without erythema or exudate.  Neck: Supple, Full range of motion, No anterior adenopathy or palpable thyroid masses Lungs: Clear to ascultation without wheezes , rhonchi, or rales Heart: Regular rate, regular rhythm without murmurs , gallops , or rubs Abdomen: Mild tenderness to deep palpation without any rebound, guarding, or rigidity noted on the right lower quadrant. No focal tenderness over McBurney's point.        Extremities: 2 plus symmetric pulses. No edema, clubbing or cyanosis Neurologic: normal ambulation, Motor symmetric without deficits, sensory intact Skin: warm, dry, no rashes   Labs:   All laboratory work  was reviewed including any pertinent negatives or positives listed below:  Labs Reviewed  COMPREHENSIVE METABOLIC PANEL - Abnormal; Notable for the following:       Result Value   Total Protein 8.3 (*)    All other components within normal limits  CBC - Abnormal; Notable for the following:    WBC 12.6 (*)    RBC 5.27 (*)    RDW 14.7 (*)    All other components within normal limits  URINALYSIS COMPLETEWITH MICROSCOPIC ([facility] ONLY) - Abnormal; Notable for the following:    Color, Urine YELLOW (*)    APPearance HAZY (*)    Leukocytes, UA 1+ (*)    Squamous Epithelial / LPF 6-30 (*)    All other components within normal limits  LIPASE, BLOOD  POC URINE PREG, ED  POCT PREGNANCY, URINE  Laboratory work was reviewed and showed no clinically significant abnormalities.    Radiology:  "Ct Renal Stone Study  Result Date: 11/04/2015 CLINICAL DATA:  Pt arrived via POV with mother with c/o right lower abdominal pain for the past week that radiates to the left and right lower abdomen. Pt states when she tries to eat she vomits off and on for the past week. Pt states she has been taking Tylenol for the pain without relief. Pt denies any fevers or chills. Denies any diarrhea. Pt also denies any urinary sxs as well. EXAM: CT ABDOMEN AND PELVIS WITHOUT CONTRAST TECHNIQUE: Multidetector CT imaging of the abdomen and pelvis was performed following the standard protocol without IV contrast. COMPARISON:  11/22/2014 FINDINGS: Lower chest: Clear lung bases.  Heart normal in size. Hepatobiliary: Normal liver. Gallbladder surgically absent. No bile duct dilation. Pancreas: Unremarkable. No pancreatic ductal dilatation or surrounding inflammatory changes. Spleen: Normal in size without focal abnormality. Adrenals/Urinary Tract: Adrenal glands are unremarkable. Kidneys are normal, without renal calculi, focal lesion, or hydronephrosis. Bladder is unremarkable. Stomach/Bowel: Stomach is within normal limits. Appendix  appears normal. No evidence of bowel wall thickening, distention, or inflammatory changes. Vascular/Lymphatic: No significant vascular findings are present. No enlarged abdominal or pelvic lymph nodes. Reproductive: Uterus is prominent, bowel without significant change from the prior CT. No adnexal masses. Other: No abdominal wall hernia or abnormality. No abdominopelvic ascites. Musculoskeletal: Unremarkable IMPRESSION: 1. No acute findings within the abdomen pelvis. 2. Normal appendix is visualized. No renal or ureteral stones or obstructive uropathy. 3. Status post cholecystectomy. Electronically Signed   By: Amie Portlandavid  Ormond M.D.   On: 11/04/2015 13:22  " I personally reviewed the radiologic studies    ED Course:  Patient's stay here was uneventful Differential diagnosis includes but is not exclusive to ovarian cyst, ovarian torsion, acute appendicitis, urinary tract infection, endometriosis,  bowel obstruction, colitis, renal colic, gastroenteritis, etc. Given the patient's current clinical presentation and objective findings I wasn't sure the exact nature of her abdominal pain at this time but I felt further testing was not necessary. Patient appears to be comfortable with normal vital signs. She has a negative noncontrasted CAT scan I felt further investigation for acute appendicitis is not necessary. Patient was advised to return here if she develops a fever, increasing pain, or any other new concerns. She may have endometriosis for a ovarian cyst. I felt was unlikely that she had ovarian torsion. Patient was advised to follow up with OB/GYN as needed. Clinical Course     Assessment:  Right lower quadrant abdominal pain in a female Undetermined etiology   Final Clinical Impression:   Final diagnoses:  Right lower quadrant abdominal pain  Pelvic pain in female     Plan:  Outpatient " Discharge Medication List as of 11/04/2015  1:41 PM    " Patient was advised to return immediately  if condition worsens. Patient was advised to follow up with their primary care physician or other specialized physicians involved in their outpatient care. The patient and/or family member/power of attorney had laboratory results reviewed at the bedside. All questions and concerns were addressed and appropriate discharge instructions were distributed by the nursing staff.             Jennye Moccasin, MD 11/04/15 [phone]

## 2015-11-04 NOTE — ED Triage Notes (Signed)
Pt arrived via POV with mother with c/o right lower abdominal pain for the past week that radiates to the left and right lower abdomen.  Pt states when she tries to eat she vomits off and on for the past week.  Pt states she has been taking Tylenol for the pain without relief.  Pt denies any fevers or chills. Denies any diarrhea. Pt also denies any urinary sxs as well.

## 2015-11-04 NOTE — ED Notes (Signed)
Patient transported to CT 

## 2015-11-07 ENCOUNTER — Emergency Department
Admission: EM | Admit: 2015-11-07 | Discharge: 2015-11-07 | Discharge status: Against Medical Advice | Payer: Medicaid Other

## 2015-11-07 ENCOUNTER — Encounter: Payer: Self-pay | Admitting: Emergency Medicine

## 2015-11-07 DIAGNOSIS — N939 Abnormal uterine and vaginal bleeding, unspecified: Secondary | ICD-10-CM | POA: Diagnosis not present

## 2015-11-07 DIAGNOSIS — R51 Headache: Secondary | ICD-10-CM | POA: Diagnosis not present

## 2015-11-07 DIAGNOSIS — R110 Nausea: Secondary | ICD-10-CM | POA: Diagnosis not present

## 2015-11-07 DIAGNOSIS — Z23 Encounter for immunization: Secondary | ICD-10-CM | POA: Insufficient documentation

## 2015-11-07 DIAGNOSIS — Y999 Unspecified external cause status: Secondary | ICD-10-CM | POA: Insufficient documentation

## 2015-11-07 DIAGNOSIS — R1030 Lower abdominal pain, unspecified: Secondary | ICD-10-CM | POA: Insufficient documentation

## 2015-11-07 DIAGNOSIS — Z79899 Other long term (current) drug therapy: Secondary | ICD-10-CM | POA: Insufficient documentation

## 2015-11-07 DIAGNOSIS — Y929 Unspecified place or not applicable: Secondary | ICD-10-CM | POA: Diagnosis not present

## 2015-11-07 DIAGNOSIS — S01112A Laceration without foreign body of left eyelid and periocular area, initial encounter: Secondary | ICD-10-CM | POA: Insufficient documentation

## 2015-11-07 DIAGNOSIS — F17200 Nicotine dependence, unspecified, uncomplicated: Secondary | ICD-10-CM | POA: Insufficient documentation

## 2015-11-07 DIAGNOSIS — Y9389 Activity, other specified: Secondary | ICD-10-CM | POA: Diagnosis not present

## 2015-11-07 LAB — CBC
HEMATOCRIT: 38.4 % (ref 35.0–47.0)
Hemoglobin: 13.2 g/dL (ref 12.0–16.0)
MCH: 27 pg (ref 26.0–34.0)
MCHC: 34.3 g/dL (ref 32.0–36.0)
MCV: 78.8 fL — AB (ref 80.0–100.0)
PLATELETS: 339 10*3/uL (ref 150–440)
RBC: 4.88 MIL/uL (ref 3.80–5.20)
RDW: 14.7 % — ABNORMAL HIGH (ref 11.5–14.5)
WBC: 20.1 10*3/uL — AB (ref 3.6–11.0)
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
## 2015-11-08 ENCOUNTER — Emergency Department
Admission: EM | Admit: 2015-11-08 | Discharge: 2015-11-08 | Disposition: A | Discharge status: Home / Self Care | Payer: Medicaid Other | Attending: Emergency Medicine | Admitting: Emergency Medicine

## 2015-11-08 ENCOUNTER — Encounter: Payer: Self-pay | Admitting: Radiology

## 2015-11-08 ENCOUNTER — Emergency Department: Payer: Medicaid Other

## 2015-11-08 DIAGNOSIS — R1031 Right lower quadrant pain: Secondary | ICD-10-CM

## 2015-11-08 DIAGNOSIS — N939 Abnormal uterine and vaginal bleeding, unspecified: Secondary | ICD-10-CM

## 2015-11-08 DIAGNOSIS — R1030 Lower abdominal pain, unspecified: Secondary | ICD-10-CM

## 2015-11-08 LAB — CBC WITH DIFFERENTIAL/PLATELET
Basophils Absolute: 0.1 10*3/uL (ref 0–0.1)
Basophils Relative: 1 %
EOS ABS: 0.1 10*3/uL (ref 0–0.7)
EOS PCT: 1 %
HCT: 36.5 % (ref 35.0–47.0)
Hemoglobin: 12.6 g/dL (ref 12.0–16.0)
LYMPHS ABS: 3 10*3/uL (ref 1.0–3.6)
LYMPHS PCT: 19 %
MCH: 27.6 pg (ref 26.0–34.0)
MCHC: 34.4 g/dL (ref 32.0–36.0)
MCV: 80.2 fL (ref 80.0–100.0)
MONO ABS: 0.9 10*3/uL (ref 0.2–0.9)
Monocytes Relative: 6 %
Neutro Abs: 12 10*3/uL — ABNORMAL HIGH (ref 1.4–6.5)
Neutrophils Relative %: 75 %
PLATELETS: 277 10*3/uL (ref 150–440)
RBC: 4.55 MIL/uL (ref 3.80–5.20)
RDW: 15.1 % — AB (ref 11.5–14.5)
WBC: 16.1 10*3/uL — ABNORMAL HIGH (ref 3.6–11.0)

## 2015-11-08 LAB — BASIC METABOLIC PANEL
Anion gap: 11 (ref 5–15)
BUN: 11 mg/dL (ref 6–20)
CHLORIDE: 108 mmol/L (ref 101–111)
CO2: 20 mmol/L — ABNORMAL LOW (ref 22–32)
CREATININE: 0.82 mg/dL (ref 0.44–1.00)
Calcium: 9.4 mg/dL (ref 8.9–10.3)
GFR calc Af Amer: >60 mL/min (ref >60)
GLUCOSE: 96 mg/dL (ref 65–99)
POTASSIUM: 3.4 mmol/L — AB (ref 3.5–5.1)
Sodium: 139 mmol/L (ref 135–145)

## 2015-11-08 LAB — WET PREP, GENITAL
Clue Cells Wet Prep HPF POC: NONE SEEN
Sperm: NONE SEEN
Trich, Wet Prep: NONE SEEN
Yeast Wet Prep HPF POC: NONE SEEN

## 2015-11-08 LAB — CHLAMYDIA/NGC RT PCR (ARMC ONLY)
CHLAMYDIA TR: DETECTED — AB
N GONORRHOEAE: DETECTED — AB

## 2015-11-08 LAB — HCG, QUANTITATIVE, PREGNANCY

## 2015-11-08 MED ORDER — TETANUS-DIPHTH-ACELL PERTUSSIS 5-2.5-18.5 LF-MCG/0.5 IM SUSP
0.5000 mL | Freq: Once | INTRAMUSCULAR | Status: AC
  Administered 2015-11-08: 0.5 mL via INTRAMUSCULAR
  Filled 2015-11-08: qty 0.5

## 2015-11-08 MED ORDER — KETOROLAC TROMETHAMINE 30 MG/ML IJ SOLN
30.0000 mg | Freq: Once | INTRAMUSCULAR | Status: AC
  Administered 2015-11-08: 30 mg via INTRAVENOUS
  Filled 2015-11-08: qty 1

## 2015-11-08 MED ORDER — IOPAMIDOL (ISOVUE-300) INJECTION 61%
100.0000 mL | Freq: Once | INTRAVENOUS | Status: AC | PRN
  Administered 2015-11-08: 100 mL via INTRAVENOUS

## 2015-11-08 MED ORDER — CEFTRIAXONE SODIUM IN DEXTROSE 20 MG/ML IV SOLN
1.0000 g | Freq: Once | INTRAVENOUS | Status: AC
  Administered 2015-11-08: 1 g via INTRAVENOUS
  Filled 2015-11-08: qty 50

## 2015-11-08 MED ORDER — DOXYCYCLINE HYCLATE 100 MG PO TABS
100.0000 mg | ORAL_TABLET | Freq: Once | ORAL | Status: AC
  Administered 2015-11-08: 100 mg via ORAL
  Filled 2015-11-08: qty 1

## 2015-11-08 MED ORDER — MORPHINE SULFATE (PF) 2 MG/ML IV SOLN
INTRAVENOUS | Status: AC
  Administered 2015-11-08: 2 mg via INTRAVENOUS
  Filled 2015-11-08: qty 1

## 2015-11-08 MED ORDER — ONDANSETRON HCL 4 MG/2ML IJ SOLN
4.0000 mg | Freq: Once | INTRAMUSCULAR | Status: AC
  Administered 2015-11-08: 4 mg via INTRAVENOUS

## 2015-11-08 MED ORDER — MORPHINE SULFATE (PF) 2 MG/ML IV SOLN
2.0000 mg | Freq: Once | INTRAVENOUS | Status: AC
  Administered 2015-11-08: 2 mg via INTRAVENOUS

## 2015-11-08 MED ORDER — ONDANSETRON HCL 4 MG/2ML IJ SOLN
INTRAMUSCULAR | Status: AC
  Administered 2015-11-08: 4 mg via INTRAVENOUS
  Filled 2015-11-08: qty 2

## 2015-11-08 NOTE — ED Notes (Signed)
Pt returned from US
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
## 2015-11-08 NOTE — ED Notes (Signed)
Pt transported to CT after receiving IV medications. Pt in NAD at this time. Pt has not had emesis since arrival to ED.

## 2015-11-08 NOTE — ED Notes (Addendum)
Pt was crying and stating that she is in pain. Dr. Darnelle CatalanMalinda is aware and pt is to be given medications. Pt calm at nurses departure from room. Pt also was given water and juice after Dr. Darnelle CatalanMalinda said that this was okay.

## 2015-11-08 NOTE — ED Notes (Signed)
Patient transported from CT to room. 
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
## 2015-11-08 NOTE — ED Notes (Signed)
Patient transported to US 

## 2015-11-08 NOTE — ED Notes (Signed)
Dr. Darnelle CatalanMalinda preformed pelvic exam.

## 2015-11-08 NOTE — Discharge Instructions (Signed)
Please follow up with your doctor or see [facility] or the [facility] or the [facility] or the [facility].Please return for worse pain, saturating a pad and hour or feeling sicker. Take the doxycycline 100 mg 1 pill 2 x a day. Use the percocet 1 pill 4 x a day if needed for pain.

## 2015-11-09 ENCOUNTER — Telehealth: Payer: Self-pay | Admitting: Emergency Medicine
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
## 2016-05-04 ENCOUNTER — Emergency Department
Admission: EM | Admit: 2016-05-04 | Discharge: 2016-05-04 | Disposition: A | Discharge status: Home / Self Care | Payer: Medicaid Other | Attending: Emergency Medicine | Admitting: Emergency Medicine

## 2016-05-04 ENCOUNTER — Emergency Department: Payer: Medicaid Other

## 2016-05-04 DIAGNOSIS — R05 Cough: Secondary | ICD-10-CM | POA: Diagnosis not present

## 2016-05-04 DIAGNOSIS — F17200 Nicotine dependence, unspecified, uncomplicated: Secondary | ICD-10-CM | POA: Insufficient documentation

## 2016-05-04 DIAGNOSIS — Z5321 Procedure and treatment not carried out due to patient leaving prior to being seen by health care provider: Secondary | ICD-10-CM | POA: Insufficient documentation

## 2016-05-04 DIAGNOSIS — R69 Illness, unspecified: Secondary | ICD-10-CM

## 2016-05-04 DIAGNOSIS — J111 Influenza due to unidentified influenza virus with other respiratory manifestations: Secondary | ICD-10-CM

## 2016-05-04 MED ORDER — PREDNISONE 10 MG (21) PO TBPK: ORAL_TABLET | ORAL | 0 refills | Status: DC

## 2016-05-04 MED ORDER — IPRATROPIUM-ALBUTEROL 0.5-2.5 (3) MG/3ML IN SOLN
3.0000 mL | Freq: Once | RESPIRATORY_TRACT | Status: AC
  Administered 2016-05-04: 3 mL via RESPIRATORY_TRACT
  Filled 2016-05-04: qty 3

## 2016-05-04 MED ORDER — ALBUTEROL SULFATE HFA 108 (90 BASE) MCG/ACT IN AERS: 2.0000 | INHALATION_SPRAY | Freq: Four times a day (QID) | RESPIRATORY_TRACT | 0 refills | Status: DC | PRN

## 2016-05-04 MED ORDER — ONDANSETRON 4 MG PO TBDP
4.0000 mg | ORAL_TABLET | Freq: Once | ORAL | Status: AC
  Administered 2016-05-04: 4 mg via ORAL
  Filled 2016-05-04: qty 1

## 2016-05-04 MED ORDER — GUAIFENESIN-CODEINE 100-10 MG/5ML PO SYRP: 5.0000 mL | ORAL_SOLUTION | Freq: Three times a day (TID) | ORAL | 0 refills | Status: AC | PRN

## 2016-05-04 MED ORDER — FLUTICASONE PROPIONATE 50 MCG/ACT NA SUSP: 2.0000 | Freq: Every day | NASAL | 0 refills | Status: DC

## 2016-05-04 NOTE — ED Provider Notes (Signed)
ED ECG REPORT I, Nita Sickle, the attending physician, personally viewed and interpreted this ECG.  Normal sinus rhythm, rate of 78, normal intervals, normal axis, no ST elevations or depressions. Normal EKG.   Nita Sickle, MD 05/04/16 [phone]

## 2016-05-04 NOTE — ED Provider Notes (Signed)
[facility] Emergency Department Provider Note  ____________________________________________  Time seen: Approximately 4:24 PM  I have reviewed the triage vital signs and the nursing notes.   HISTORY  Chief Complaint Cough    HPI Holly Berry is a 31 y.o. female that presents to emergency department with 4 days of fever, chills, muscle aches, fatigue, headache, congestion, nonproductive cough, and nausea. Patient states that she coughs so hard she vomits. She states that when she is coughing she gets pain in her chest. She does not have any pain in her chest when she is not coughing. Patient is drinking well but is having difficulty eating due to nausea. She has taken TheraFlu, over-the-counter cough syrup, nausea medicine, and her mother's inhalers for symptoms. She smoked 1/2 a pack a day and quit 4 days ago. Patient denies sore throat, vomiting, diarrhea, constipation.   Past Medical History:  Diagnosis Date  . Ovarian cyst     Patient Active Problem List   Diagnosis Date Noted  . Postoperative state 09/17/2012  . History of cesarean section 09/17/2012  . Premature labor 09/09/2012  . Normal pregnancy, incidental 09/09/2012    Past Surgical History:  Procedure Laterality Date  . CESAREAN SECTION    . CHOLECYSTECTOMY    . FOOT SURGERY Left   . HAND SURGERY Right   . KNEE SURGERY Right   . TUBAL LIGATION    . TUBAL LIGATION      Prior to Admission medications   Medication Sig Start Date End Date Taking? Authorizing Provider  acetaminophen (TYLENOL) 500 MG tablet Take by mouth.    Historical Provider, MD  albuterol (PROVENTIL HFA;VENTOLIN HFA) 108 (90 Base) MCG/ACT inhaler Inhale 2 puffs into the lungs every 6 (six) hours as needed for wheezing or shortness of breath. 05/04/16   Enid Derry, PA-C  dicyclomine (BENTYL) 20 MG tablet Take 1 tablet (20 mg total) by mouth 3 (three) times daily as needed for spasms. 02/10/15   Sharman Cheek, MD   fluticasone [facility]) 50 MCG/ACT nasal spray Place 2 sprays into both nostrils daily. 05/04/16 05/04/17  Enid Derry, PA-C  guaiFENesin-codeine (ROBITUSSIN AC) 100-10 MG/5ML syrup Take 5 mLs by mouth 3 (three) times daily as needed for cough. 05/04/16 05/06/16  Enid Derry, PA-C  HYDROcodone-acetaminophen (NORCO) 5-325 MG tablet Take 1 tablet by mouth every 6 (six) hours as needed for moderate pain. 11/04/15   Jennye Moccasin, MD  metoCLOPramide (REGLAN) 5 MG tablet Take 1 tablet (5 mg total) by mouth every 8 (eight) hours as needed for nausea or vomiting. 11/22/14   Jenise V Bacon Menshew, PA-C  nitrofurantoin, macrocrystal-monohydrate, (MACROBID) 100 MG capsule Take 1 capsule (100 mg total) by mouth 2 (two) times daily. 11/22/14   Jenise V Bacon Menshew, PA-C  predniSONE (STERAPRED UNI-PAK 21 TAB) 10 MG (21) TBPK tablet Take 6 tablets on day 1, take 5 tablets on day 2, take 4 tablets on day 3, take 3 tablets on day 4, take 2 tablets on day 5, take 1 tablet on day 6 05/04/16   Enid Derry, PA-C  Prenatal MV-Min-Fe Fum-FA-DHA (PRENATAL 1 PO) Take by mouth. 09/17/12   Historical Provider, MD  promethazine (PHENERGAN) 12.5 MG tablet Take 1 tablet (12.5 mg total) by mouth every 6 (six) hours as needed for nausea or vomiting. 11/04/15   Jennye Moccasin, MD  ranitidine (ZANTAC) 150 MG capsule Take 1 capsule (150 mg total) by mouth 2 (two) times daily. 02/10/15   Sharman Cheek, MD  sulfamethoxazole-trimethoprim (BACTRIM DS,SEPTRA DS) 800-160 MG per tablet Take 1 tablet by mouth 2 (two) times daily. 07/17/14   Tommi Rumps, PA-C    Allergies Penicillins and Tramadol  No family history on file.  Social History Social History  Substance Use Topics  . Smoking status: Current Every Day Smoker  . Smokeless tobacco: Never Used  . Alcohol use No     Review of Systems  Constitutional: Positive for chills. Eyes: No visual changes. No discharge. ENT: Positive for congestion and  rhinorrhea. Cardiovascular: Positive for chest pain when coughing. Respiratory: Positive for cough. Positive for shortness of breath when coughing. Gastrointestinal: No abdominal pain.  Positive for nausea.  No diarrhea.  No constipation. Musculoskeletal: Positive for muscle aches. Skin: Negative for rash, abrasions, lacerations, ecchymosis. Neurological: Positive for headache.   ____________________________________________   PHYSICAL EXAM:  VITAL SIGNS: ED Triage Vitals  Enc Vitals Group     BP 05/04/16 1533 (!) 143/91     Pulse Rate 05/04/16 1533 85     Resp 05/04/16 1533 18     Temp 05/04/16 1533 98.4 F (36.9 C)     Temp Source 05/04/16 1533 Oral     SpO2 05/04/16 1533 97 %     Weight 05/04/16 1534 230 lb (104.3 kg)     Height 05/04/16 1534  (1.702 m)     Head Circumference --      Peak Flow --      Pain Score 05/04/16 1533 9     Pain Loc --      Pain Edu? --      Excl. in GC? --      Constitutional: Alert and oriented. Well appearing and in no acute distress. Eyes: Conjunctivae are normal. PERRL. EOMI. No discharge. Head: Atraumatic. ENT: No frontal and maxillary sinus tenderness.      Ears: Tympanic membranes pearly gray with good landmarks. No discharge.      Nose: Mild congestion/rhinnorhea.      Mouth/Throat: Mucous membranes are moist. Oropharynx non-erythematous. Tonsils not enlarged. No exudates. Uvula midline. Neck: No stridor.   Hematological/Lymphatic/Immunilogical: No cervical lymphadenopathy. Cardiovascular: Normal rate, regular rhythm.  Good peripheral circulation. Respiratory: Normal respiratory effort without tachypnea or retractions. Lungs CTAB. Good air entry to the bases with no decreased or absent breath sounds. Gastrointestinal: Bowel sounds 4 quadrants. Soft and nontender to palpation. No guarding or rigidity. No palpable masses. No distention. Musculoskeletal: Full range of motion to all extremities. No gross deformities  appreciated. Neurologic:  Normal speech and language. No gross focal neurologic deficits are appreciated.  Skin:  Skin is warm, dry and intact. No rash noted.   ____________________________________________   LABS (all labs ordered are listed, but only abnormal results are displayed)  Labs Reviewed - No data to display ____________________________________________  EKG   ____________________________________________  RADIOLOGY Lexine Baton, personally viewed and evaluated these images (plain radiographs) as part of my medical decision making, as well as reviewing the written report by the radiologist.  Dg Chest 2 View  Result Date: 05/04/2016 CLINICAL DATA:  Initial evaluation for acute cough. Recently quit smoking. EXAM: CHEST  2 VIEW COMPARISON:  Prior radiograph from 11/26/2009. FINDINGS: The cardiac and mediastinal silhouettes are stable in size and contour, and remain within normal limits. Mild elevation left hemidiaphragm in few prominent gas-filled loops of bowel noted within the left abdomen. No airspace consolidation, pleural effusion, or pulmonary edema is identified. There is no pneumothorax. No acute osseous abnormality identified. IMPRESSION: No  radiographic evidence for active cardiopulmonary disease. Electronically Signed   By: Rise Mu M.D.   On: 05/04/2016 16:05    ____________________________________________    PROCEDURES  Procedure(s) performed:    Procedures    Medications  ondansetron (ZOFRAN-ODT) disintegrating tablet 4 mg (4 mg Oral Given 05/04/16 1629)  ipratropium-albuterol (DUONEB) 0.5-2.5 (3) MG/3ML nebulizer solution 3 mL (3 mLs Nebulization Given 05/04/16 1629)  ipratropium-albuterol (DUONEB) 0.5-2.5 (3) MG/3ML nebulizer solution 3 mL (3 mLs Nebulization Given 05/04/16 1718)     ____________________________________________   INITIAL IMPRESSION / ASSESSMENT AND PLAN / ED COURSE  Pertinent labs & imaging results that were available  during my care of the patient were reviewed by me and considered in my medical decision making (see chart for details).  Review of the [place] CSRS was performed in accordance of the [facility] prior to dispensing any controlled drugs.     Patient's diagnosis is consistent with influenza. Vital signs and exam are reassuring. She is afebrile in ED and has not had any Tylenol or ibuprofen today. No acute cardiopulmonary processes indicated on chest x-ray per radiology. Symptoms have been present for 4 days and symptoms are consistent with viral illness. She was given 2 DuoNeb times in ED, which helped her breathing. Patient should alternate tylenol and ibuprofen for fever. Patient feels comfortable going home. Patient will be discharged home with prescriptions for prednisone, Robitussin, Flonase, albuterol inhaler. Patient is to follow up with PCP as needed or otherwise directed. Patient is given ED precautions to return to the ED for any worsening or new symptoms.     ____________________________________________  FINAL CLINICAL IMPRESSION(S) / ED DIAGNOSES  Final diagnoses:  Influenza-like illness      NEW MEDICATIONS STARTED DURING THIS VISIT:  Discharge Medication List as of 05/04/2016  5:50 PM    START taking these medications   Details  albuterol (PROVENTIL HFA;VENTOLIN HFA) 108 (90 Base) MCG/ACT inhaler Inhale 2 puffs into the lungs every 6 (six) hours as needed for wheezing or shortness of breath., Starting Sun 05/04/2016, Print    fluticasone (FLONASE) 50 MCG/ACT nasal spray Place 2 sprays into both nostrils daily., Starting Sun 05/04/2016, Until Mon 05/04/2017, Print    guaiFENesin-codeine (ROBITUSSIN AC) 100-10 MG/5ML syrup Take 5 mLs by mouth 3 (three) times daily as needed for cough., Starting Sun 05/04/2016, Until Tue 05/06/2016, Print    predniSONE (STERAPRED UNI-PAK 21 TAB) 10 MG (21) TBPK tablet Take 6 tablets on day 1, take 5 tablets on day 2, take 4 tablets on day 3, take 3 tablets on day  4, take 2 tablets on day 5, take 1 tablet on day 6, Print            This chart was dictated using voice recognition software/Dragon. Despite best efforts to proofread, errors can occur which can change the meaning. Any change was purely unintentional.    Enid Derry, PA-C 05/04/16 1610    Merrily Brittle, MD 05/07/16 1057

## 2016-05-04 NOTE — ED Triage Notes (Signed)
Pt states that she has been feeling bad for the past 4 days, states that she is coughing, congested, and coughs so hard she vomits, pt reports that when she breathes deep and coughs she has pain in her chest, pt states that her chest feels heavy and that she has been using her mom's inhaler in attempt to "break it up"

## 2016-08-07 IMAGING — CT CT ABD-PELV W/ CM
2 of 4 series · 16 of 46 positions shown, 18 images · IV contrast (isovue)
Comparison: 09/21/2013

CLINICAL DATA: Bilateral flank pain, rt>lt, that radiates to pelvis
x 1 week, nausea, diarrhea and chills, hx of BTL and
cholecystectomy, neg. upt, 125 mlisovue 370 given.

EXAM:
CT ABDOMEN AND PELVIS WITH CONTRAST
TECHNIQUE: Multidetector CT imaging of the abdomen and pelvis was performed
using the standard protocol following bolus administration of
intravenous contrast.
CONTRAST:  125 mL of Isovue 370 intravenous contrast

[Series 2: routine abd pel with · axial · 0.77mm/px · z∈[-530,-54]mm · 13 of 105 slices shown, 15 images]
[im 5/105  soft-tissue]
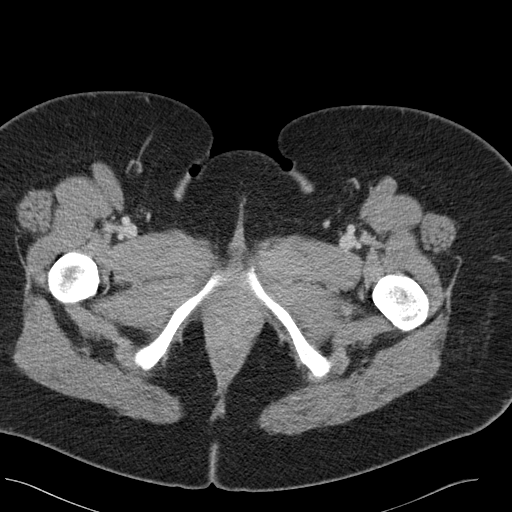
[im 5/105  bone]
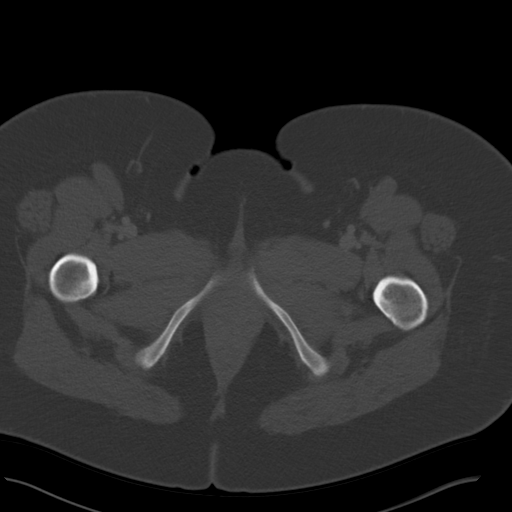
[im 13/105  soft-tissue]
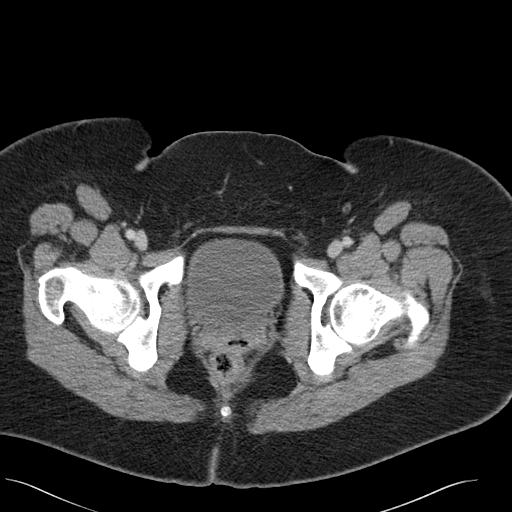
[im 21/105  soft-tissue]
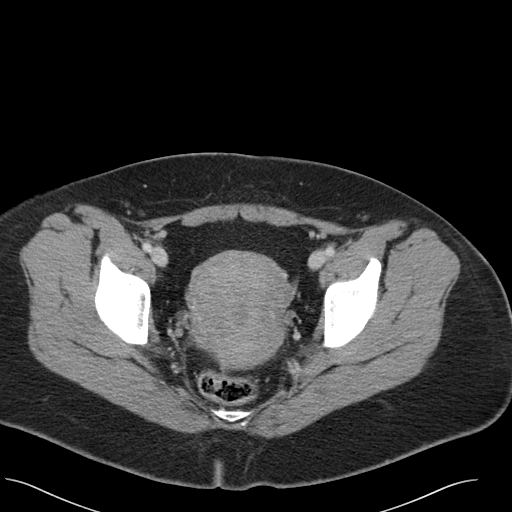
[im 30/105  soft-tissue]
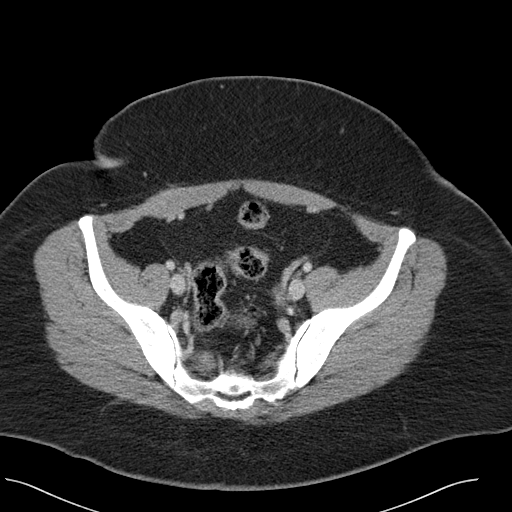
[im 38/105  soft-tissue]
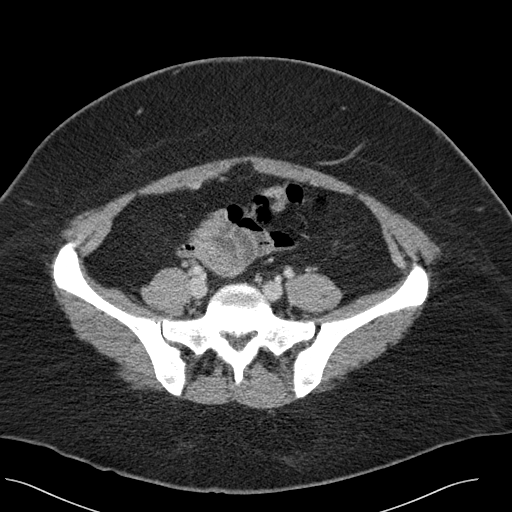
[im 46/105  soft-tissue]
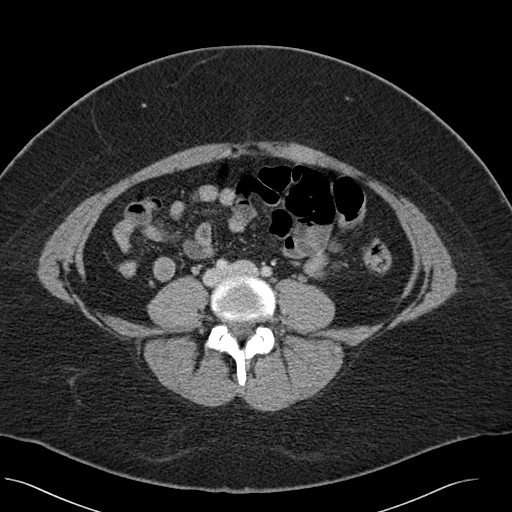
[im 55/105  soft-tissue]
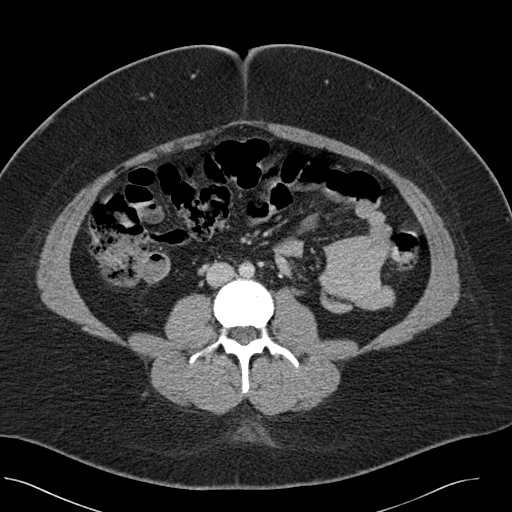
[im 59/105  soft-tissue]
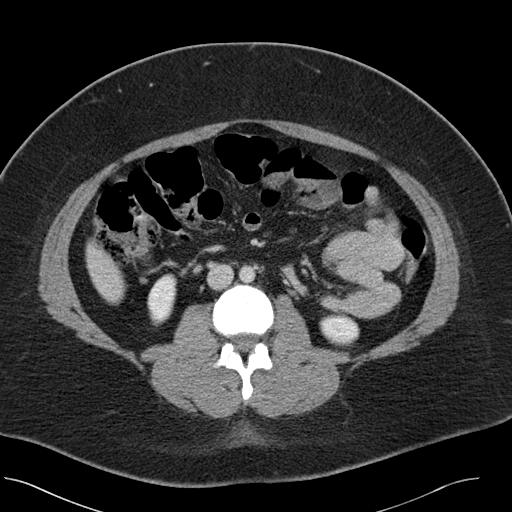
[im 67/105  soft-tissue]
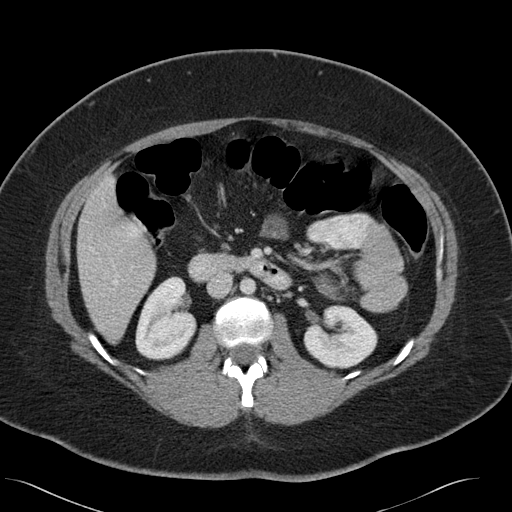
[im 67/105  bone]
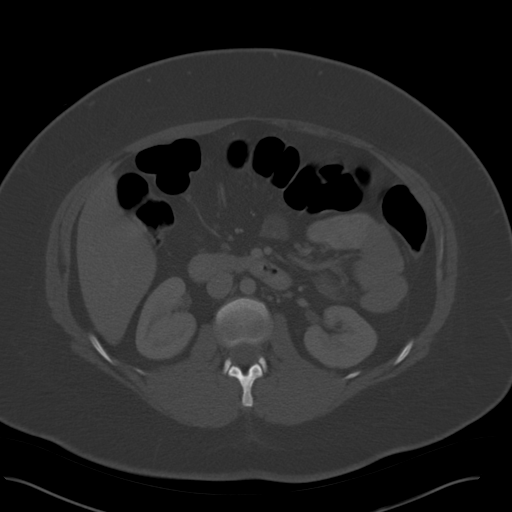
[im 75/105  soft-tissue]
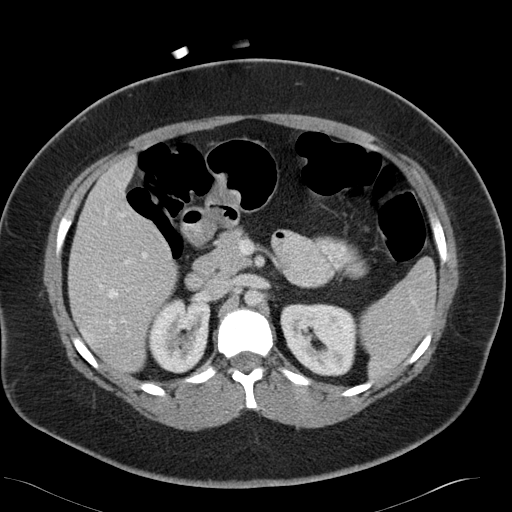
[im 84/105  soft-tissue]
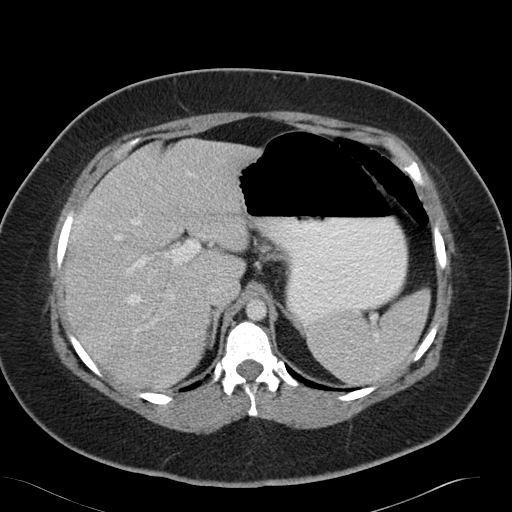
[im 92/105  soft-tissue]
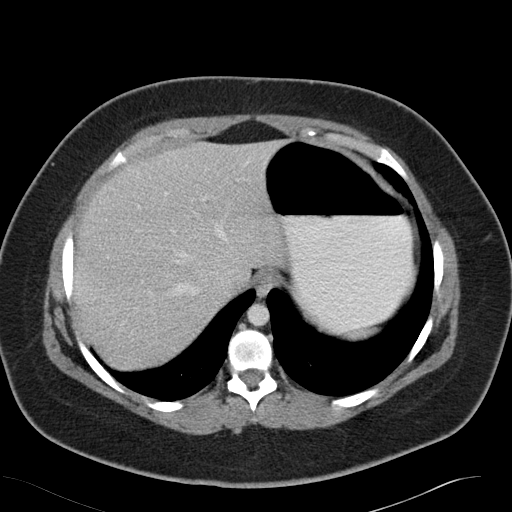
[im 100/105  soft-tissue]
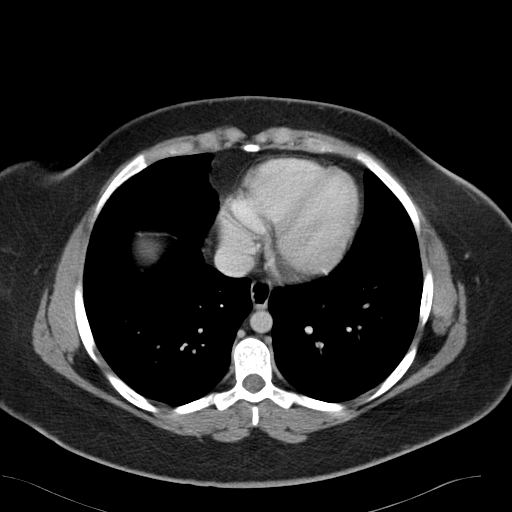

[Series 5: cor routine abd pel with · coronal · 0.76mm/px · 3 of 150 slices shown]
[im 50/150  soft-tissue]
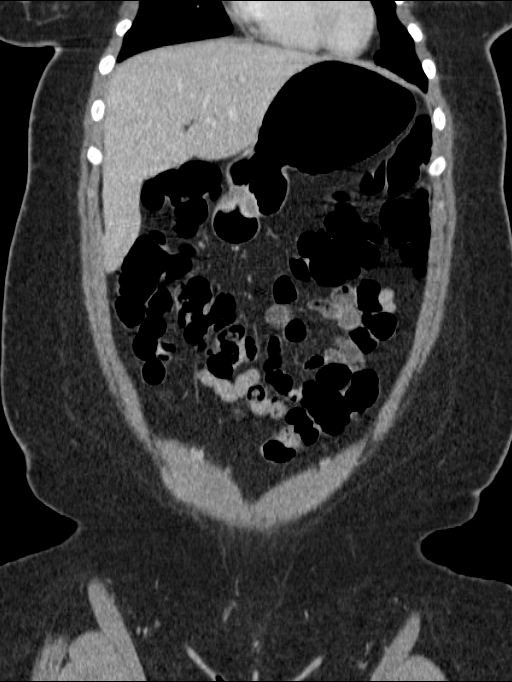
[im 67/150  soft-tissue]
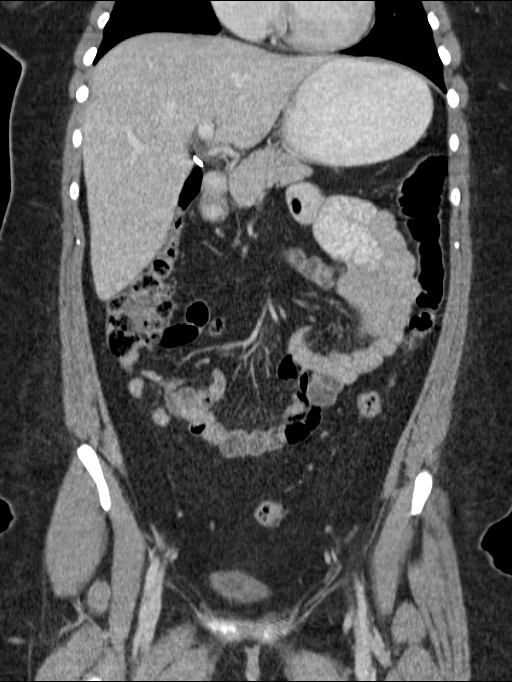
[im 83/150  soft-tissue]
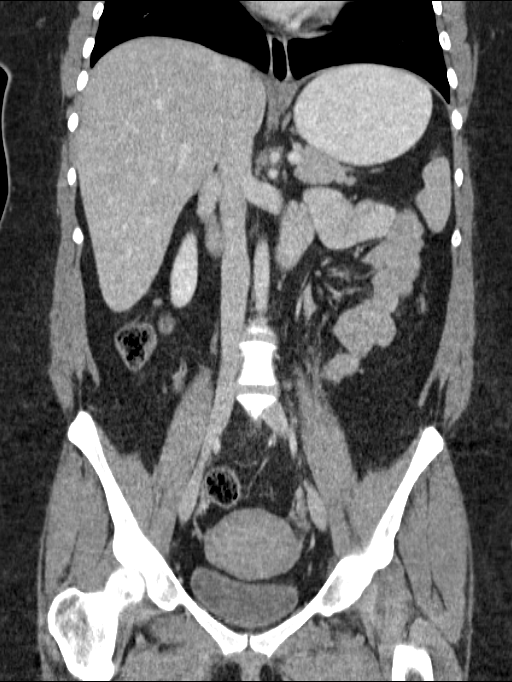

[16 of 46 positions shown; findings below may reference images not displayed]

FINDINGS: Clear lung bases.  Heart is normal in size.

Liver, spleen, pancreas: Normal. Gallbladder surgically absent. No
bile duct dilation.

Normal adrenal glands. Normal kidneys. No renal stones or
hydronephrosis. Normal ureters and bladder.

Uterus and adnexa are unremarkable.

No pathologically enlarged lymph nodes. There are no abnormal fluid
collections.

Colon and small bowel are unremarkable. Normal appendix is
visualized.

No significant bony abnormality.
IMPRESSION: 1. No acute findings. No findings to explain flank pain. No renal or
ureteral stones or obstructive uropathy.
2. Status post cholecystectomy.  No other abnormalities.

## 2017-11-30 ENCOUNTER — Ambulatory Visit: Payer: Self-pay | Admitting: Urology

## 2017-12-01 ENCOUNTER — Encounter: Payer: Self-pay | Admitting: Urology

## 2018-07-28 DIAGNOSIS — Z22322 Carrier or suspected carrier of Methicillin resistant Staphylococcus aureus: Secondary | ICD-10-CM

## 2018-07-28 HISTORY — DX: Carrier or suspected carrier of methicillin resistant Staphylococcus aureus: Z22.322

## 2018-08-18 ENCOUNTER — Other Ambulatory Visit: Payer: Self-pay

## 2018-08-18 ENCOUNTER — Ambulatory Visit
Admission: EM | Admit: 2018-08-18 | Discharge: 2018-08-18 | Disposition: A | Discharge status: Home / Self Care | Payer: Medicaid Other | Attending: Emergency Medicine | Admitting: Emergency Medicine

## 2018-08-18 DIAGNOSIS — L0390 Cellulitis, unspecified: Secondary | ICD-10-CM | POA: Insufficient documentation

## 2018-08-18 MED ORDER — SULFAMETHOXAZOLE-TRIMETHOPRIM 800-160 MG PO TABS: 1.0000 | ORAL_TABLET | Freq: Two times a day (BID) | ORAL | 0 refills | Status: AC

## 2018-08-18 MED ORDER — MUPIROCIN 2 % EX OINT: TOPICAL_OINTMENT | CUTANEOUS | 0 refills | Status: DC

## 2018-08-18 NOTE — ED Provider Notes (Signed)
[facility] ____________________________________________  Time seen: Approximately 3:53 PM  I have reviewed the triage vital signs and the nursing notes.   HISTORY  Chief Complaint Insect Bite   HPI Holly Berry is a 33 y.o. female presenting for evaluation of redness and skin changes to left posterior shoulder.  Patient reports few days ago she had a small typical looking pimple to the same area.  States that she squeezed it and then left it alone but she had gradual increasing size of redness that has continued.  States today the area is very tender.  Occasionally noticed some oozing from it.  Denies pain radiation or other skin changes.  Denies tick bites or known insect bite.  States that she believes it was just a pimple.  Intermittently takes doxycycline for acne and did take 1 dose.  Has not recently been taking doxycycline.  Denies fevers.  Denies recent cough, congestion, chest pain, shortness of breath or other complaints.  Reports otherwise doing well.  Reports tetanus immunization up-to-date.  [facility]: PCP Patient's last menstrual period was 07/30/2018.Denies fevers.    Past Medical History:  Diagnosis Date  . Ovarian cyst     Patient Active Problem List   Diagnosis Date Noted  . Postoperative state 09/17/2012  . History of cesarean section 09/17/2012  . Premature labor 09/09/2012  . Normal pregnancy, incidental 09/09/2012    Past Surgical History:  Procedure Laterality Date  . CESAREAN SECTION    . CHOLECYSTECTOMY    . FOOT SURGERY Left   . HAND SURGERY Right   . KNEE SURGERY Right   . TUBAL LIGATION    . TUBAL LIGATION       No current facility-administered medications for this encounter.   Current Outpatient Medications:  .  acetaminophen (TYLENOL) 500 MG tablet, Take by mouth., Disp: , Rfl:  .  doxycycline (MONODOX) 100 MG capsule, Take 100 mg by mouth 2 (two) times daily., Disp: , Rfl:  .  fluticasone  (FLONASE) 50 MCG/ACT nasal spray, Place 2 sprays into both nostrils daily., Disp: 16 g, Rfl: 0 .  mupirocin ointment (BACTROBAN) 2 %, Apply two times a day for 7 days., Disp: 22 g, Rfl: 0 .  sulfamethoxazole-trimethoprim (BACTRIM DS) 800-160 MG tablet, Take 1 tablet by mouth 2 (two) times daily for 10 days., Disp: 20 tablet, Rfl: 0  Allergies Penicillins and Tramadol  History reviewed. No pertinent family history.  Social History Social History   Tobacco Use  . Smoking status: Current Every Day Smoker    Packs/day: 0.50    Types: Cigarettes  . Smokeless tobacco: Never Used  Substance Use Topics  . Alcohol use: No  . Drug use: No    Review of Systems Constitutional: No fever ENT: No sore throat. Cardiovascular: Denies chest pain. Respiratory: Denies shortness of breath. Gastrointestinal: No abdominal pain.   Musculoskeletal: Negative for back pain. Skin: Positive for rash.  ____________________________________________   PHYSICAL EXAM:  VITAL SIGNS: ED Triage Vitals  Enc Vitals Group     BP 08/18/18 1536 (!) 130/98     Pulse Rate 08/18/18 1536 94     Resp 08/18/18 1536 18     Temp 08/18/18 1536 98.2 F (36.8 C)     Temp Source 08/18/18 1536 Oral     SpO2 08/18/18 1536 100 %     Weight 08/18/18 1532 193 lb (87.5 kg)     Height 08/18/18 1532 5' 7.5" (1.715 m)  Head Circumference --      Peak Flow --      Pain Score 08/18/18 1531 8     Pain Loc --      Pain Edu? --      Excl. in [place]? --     Constitutional: Alert and oriented. Well appearing and in no acute distress. Eyes: Conjunctivae are normal ENT      Head: Normocephalic and atraumatic. Cardiovascular: Normal rate, regular rhythm. Grossly normal heart sounds.  Good peripheral circulation. Respiratory: Normal respiratory effort without tachypnea nor retractions. Breath sounds are clear and equal bilaterally. No wheezes, rales, rhonchi. Musculoskeletal:  Steady gait.  Neurologic:  Normal speech and  language.  Speech is normal. No gait instability.  Skin:  Skin is warm, dry.  Except: left posterior shoulder 3cmx3cm area of erythema with blackened excoriation with minimal oozing, tenderness, induration, no fluctuance, no further surrounding erythema. Psychiatric: Mood and affect are normal. Speech and behavior are normal. Patient exhibits appropriate insight and judgment   ___________________________________________   LABS (all labs ordered are listed, but only abnormal results are displayed)  Labs Reviewed  AEROBIC CULTURE (SUPERFICIAL SPECIMEN)   ____________________________________________   PROCEDURES Procedures   INITIAL IMPRESSION / ASSESSMENT AND PLAN / ED COURSE  Pertinent labs & imaging results that were available during my care of the patient were reviewed by me and considered in my medical decision making (see chart for details).  Well-appearing patient.  No acute distress.  Area of cellulitis.  Will treat with Bactrim and topical Bactroban.  Encourage monitoring and supportive care.  wound care. Discussed indication, risks and benefits of medications with patient.  Discussed follow up with Primary care physician this week. Discussed follow up and return parameters including no resolution or any worsening concerns. Patient verbalized understanding and agreed to plan.   ____________________________________________   FINAL CLINICAL IMPRESSION(S) / ED DIAGNOSES  Final diagnoses:  Cellulitis, unspecified cellulitis site     ED Discharge Orders         Ordered    sulfamethoxazole-trimethoprim (BACTRIM DS) 800-160 MG tablet  2 times daily     08/18/18 1554    mupirocin ointment (BACTROBAN) 2 %     08/18/18 1554           Note: This dictation was prepared with Dragon dictation along with smaller phrase technology. Any transcriptional errors that result from this process are unintentional.         Marylene Land, NP 08/18/18 1617

## 2018-08-18 NOTE — ED Triage Notes (Signed)
Patient complains of a possible spider bite on her back that she noticed around 3 days ago and has been worsening. States that area is painful and burns.

## 2018-08-18 NOTE — Discharge Instructions (Addendum)
Take medication as prescribed. Rest. Drink plenty of fluids. Keep clean. Warm compresses.Monitor.  Follow up with your primary care physician this week as needed. Return to Urgent care for new or worsening concerns.

## 2018-08-21 LAB — AEROBIC CULTURE W GRAM STAIN (SUPERFICIAL SPECIMEN)

## 2018-08-21 LAB — AEROBIC CULTURE? (SUPERFICIAL SPECIMEN)

## 2018-08-23 ENCOUNTER — Telehealth (HOSPITAL_COMMUNITY): Payer: Self-pay | Admitting: Emergency Medicine

## 2018-08-23 NOTE — Telephone Encounter (Signed)
Treated appropriately with Bactrim, attempted to reach patient, number not in service.

## 2018-08-24 ENCOUNTER — Telehealth: Payer: Self-pay | Admitting: Emergency Medicine

## 2018-11-04 ENCOUNTER — Telehealth: Payer: Self-pay | Admitting: Obstetrics & Gynecology

## 2018-11-04 ENCOUNTER — Encounter: Payer: Medicaid Other | Admitting: Obstetrics & Gynecology

## 2018-11-04 NOTE — Telephone Encounter (Signed)
Holly Berry referring for Dysmenorrhea. Patient aware of date, time and location.Patient was schedule for 11/04/18 and No showed. Attempt to reach patient to reschedule phone on file is not accepting calls at this time

## 2018-11-15 ENCOUNTER — Ambulatory Visit (INDEPENDENT_AMBULATORY_CARE_PROVIDER_SITE_OTHER): Payer: Medicaid Other | Admitting: Obstetrics & Gynecology

## 2018-11-15 ENCOUNTER — Encounter: Payer: Self-pay | Admitting: Obstetrics & Gynecology

## 2018-11-15 ENCOUNTER — Other Ambulatory Visit: Payer: Self-pay

## 2018-11-15 ENCOUNTER — Other Ambulatory Visit (HOSPITAL_COMMUNITY)
Admission: RE | Admit: 2018-11-15 | Discharge: 2018-11-15 | Disposition: A | Discharge status: Home / Self Care | Payer: Medicaid Other | Source: Ambulatory Visit | Attending: Obstetrics & Gynecology | Admitting: Obstetrics & Gynecology

## 2018-11-15 VITALS — BP 120/80 | Ht 67.5 in | Wt 202.0 lb

## 2018-11-15 DIAGNOSIS — N921 Excessive and frequent menstruation with irregular cycle: Secondary | ICD-10-CM | POA: Insufficient documentation

## 2018-11-15 DIAGNOSIS — Z124 Encounter for screening for malignant neoplasm of cervix: Secondary | ICD-10-CM | POA: Diagnosis not present

## 2018-11-15 DIAGNOSIS — N920 Excessive and frequent menstruation with regular cycle: Secondary | ICD-10-CM | POA: Diagnosis not present

## 2018-11-15 DIAGNOSIS — N946 Dysmenorrhea, unspecified: Secondary | ICD-10-CM

## 2018-11-15 DIAGNOSIS — N809 Endometriosis, unspecified: Secondary | ICD-10-CM | POA: Diagnosis not present

## 2018-11-15 MED ORDER — MELOXICAM 7.5 MG PO TABS: 7.5000 mg | ORAL_TABLET | Freq: Every day | ORAL | 0 refills | Status: DC

## 2018-11-15 NOTE — Patient Instructions (Signed)
Transvaginal Ultrasound A transvaginal ultrasound, also called an endovaginal ultrasound, is a test that uses sound waves to take pictures of the female genital tract. The pictures are taken with a device, called a transducer, that is placed in the vagina. This test may be done to:  Check for problems with your pregnancy.  Check your developing baby.  Check for anything abnormal in the uterus or ovaries.  Find out why you have pelvic pain or bleeding. Tell a health care provider about:  Any allergies you have.  All medicines you are taking, including vitamins, herbs, eye drops, creams, and over-the-counter medicines.  Any blood disorders you have.  Any surgeries you have had.  Any medical conditions you have.  Whether you are pregnant or may be pregnant.  Whether you are having your menstrual period. What are the risks? This is a safe procedure. There are no known risks or complications of having this test. What happens before the procedure? This procedure needs to be done when your bladder is empty. Follow your health care provider's instructions about drinking fluids and emptying your bladder before the test. What happens during the procedure?   You will empty your bladder before the procedure.  You will undress from the waist down.  You will lie down on an exam table, with your knees bent and your feet in foot holders.  A health care provider will cover the transducer with a sterile cover.  A gel will be put on the transducer. The gel helps transmit the sound waves and prevents irritation of your vagina.  The technician will insert the transducer into your vagina to get images. These will be displayed on a monitor that looks like a small television screen.  The transducer will be removed when the procedure is complete. The procedure may vary among health care providers and hospitals. What happens after the procedure?  It is up to you to get the results of your  procedure. Ask your health care provider, or the department that is doing the procedure, when your results will be ready.  Keep all follow-up visits as told by your health care provider. This is important. Summary  A transvaginal ultrasound, also called an endovaginal ultrasound, is a test that uses sound waves to take pictures of the female genital tract.  This is a safe procedure. There are no known risks associated with this test.  The procedure needs to be done when your bladder is empty. Follow your health care provider's instructions about drinking fluids and emptying your bladder before the test.  During the procedure, you will undress from the waist down and lie down on an exam table. A technician will insert a transducer into your vagina to obtain images.  Ask your health care provider, or the department that is doing the procedure, when your results will be ready. This information is not intended to replace advice given to you by your health care provider. Make sure you discuss any questions you have with your health care provider. Document Released: 12/26/2003 Document Revised: 08/26/2017 Document Reviewed: 08/26/2017 Elsevier Patient Education  2020 Elsevier Inc.  

## 2018-11-15 NOTE — Progress Notes (Signed)
Dysmenorrhea/ Premenstrual Syndrome Patient is a 33 yo G5 P4014 WF who complains of menstrual symptoms that have been worseniong significantly over recent months, although she has had increasing period volume and cramping since her tubal in 2014.  She had prior vaginal deliveries and then a Cesarean for her last delivery then.  She has been told she has endometriosis.  Prior OCP made bleeding worse.  Could not tolerate IUD placement.  Procera in past helped only a little for the temporary tx. Patient describes symptoms of bloating/fluid retention (moderate), dyspareunia (severe), menorrhagia (severe) and pelvic pain (severe). Symptoms occur erratically during the cycle. Patient denies decreased libido, depression and insomnia. Evaluation to date includes: none. Treatment to date includes: OTC NSAIDs (not very effective), Oral contraceptive pills per medication list:(made things worse) and IUD attempt but MD could not place w two tries, reported scsarred or angled uterus/cervix.. The patient is sexually active. Anemia too, takes iron, is weak and fatigued.  Has not been able to work for the last week.  PMHx: She  has a past medical history of Ovarian cyst. Also,  has a past surgical history that includes Tubal ligation; Cholecystectomy; Knee surgery (Right); Hand surgery (Right); Foot surgery (Left); Cesarean section; and Tubal ligation., family history is not on file.,  reports that she has been smoking cigarettes. She has been smoking about 0.50 packs per day. She has never used smokeless tobacco. She reports that she does not drink alcohol or use drugs.  She has a current medication list which includes the following prescription(s): medroxyprogesterone, acetaminophen, doxycycline, fluticasone, mupirocin ointment, albuterol, dicyclomine, metoclopramide, promethazine, and ranitidine. Also, is allergic to penicillins and tramadol.  Review of Systems  Constitutional: Positive for malaise/fatigue. Negative  for chills and fever.  HENT: Negative for congestion, sinus pain and sore throat.   Eyes: Negative for blurred vision and pain.  Respiratory: Negative for cough and wheezing.   Cardiovascular: Negative for chest pain and leg swelling.  Gastrointestinal: Positive for abdominal pain and diarrhea. Negative for constipation, heartburn, nausea and vomiting.  Genitourinary: Negative for dysuria, frequency, hematuria and urgency.  Musculoskeletal: Positive for back pain. Negative for joint pain, myalgias and neck pain.  Skin: Negative for itching and rash.  Neurological: Positive for dizziness. Negative for tremors and weakness.  Endo/Heme/Allergies: Bruises/bleeds easily.  Psychiatric/Behavioral: Negative for depression. The patient is not nervous/anxious and does not have insomnia.     Objective: BP 120/80    Ht 5' 7.5" (1.715 m)    Wt 202 lb (91.6 kg)    LMP 10/23/2018    BMI 31.17 kg/m  Physical Exam Constitutional:      General: She is not in acute distress.    Appearance: She is well-developed.  Genitourinary:     Pelvic exam was performed with patient supine.     Vulva, inguinal canal, urethra, bladder, vagina, cervix and rectum normal.     No lesions in the vagina.     No vaginal bleeding.     No cervical polyp.     Uterus is tender and mobile.     Uterus is not enlarged.     Uterine mass present.    Uterus is midaxial.     No right or left adnexal mass present.     Right adnexa tender.     Left adnexa tender.     Genitourinary Comments: Anterior fibroid or cyst like mass; very tender exam Cervix normal Tenderness more midline  HENT:     Head: Normocephalic and  atraumatic. No laceration.     Right Ear: Hearing normal.     Left Ear: Hearing normal.     Mouth/Throat:     Pharynx: Uvula midline.  Eyes:     Pupils: Pupils are equal, round, and reactive to light.  Neck:     Musculoskeletal: Normal range of motion and neck supple.     Thyroid: No thyromegaly.    Cardiovascular:     Rate and Rhythm: Normal rate and regular rhythm.     Heart sounds: No murmur. No friction rub. No gallop.   Pulmonary:     Effort: Pulmonary effort is normal. No respiratory distress.     Breath sounds: Normal breath sounds. No wheezing.  Chest:     Breasts:        Right: No mass, skin change or tenderness.        Left: No mass, skin change or tenderness.  Abdominal:     General: Bowel sounds are normal. There is no distension.     Palpations: Abdomen is soft.     Tenderness: There is no abdominal tenderness. There is no rebound.  Musculoskeletal: Normal range of motion.  Neurological:     Mental Status: She is alert and oriented to person, place, and time.     Cranial Nerves: No cranial nerve deficit.  Skin:    General: Skin is warm and dry.  Psychiatric:        Judgment: Judgment normal.  Vitals signs reviewed.     ASSESSMENT/PLAN:    Problem List Items Addressed This Visit      Genitourinary   Dysmenorrhea   Relevant Orders   US PELVIC COMPLETE WITH TRANSVAGINAL     Other   Menorrhagia with regular cycle - Primary   Relevant Orders   US PELVIC COMPLETE WITH TRANSVAGINAL   Endometriosis    Other Visit Diagnoses    Screening for cervical cancer       Relevant Orders   Cytology - PAP    Patient has abnormal uterine bleeding and pain. She has a normal but painfuil exam today with suggestion of mass.  Evaluation includes the following: exam and pelvic ultrasound to evaluate for any structural gynecologic abnormalities.  Patient to follow up after testing.  Treatment option for menorrhagia or menometrorrhagia discussed in great detail with the patient.  Options include hormonal therapy, IUD therapy such as Mirena, D&C, Ablation, and Hysterectomy.  The pros and cons of each option discussed with patient.  Meloxicam for pain  Barnett Applebaum, MD, [facility], [facility] 11/15/2018  11:17 AM

## 2018-11-17 ENCOUNTER — Encounter: Payer: Self-pay | Admitting: Obstetrics & Gynecology

## 2018-11-17 NOTE — Telephone Encounter (Signed)
Please advise 

## 2018-11-18 ENCOUNTER — Encounter: Payer: Self-pay | Admitting: Obstetrics & Gynecology

## 2018-11-18 ENCOUNTER — Ambulatory Visit (INDEPENDENT_AMBULATORY_CARE_PROVIDER_SITE_OTHER): Payer: Medicaid Other | Admitting: Obstetrics & Gynecology

## 2018-11-18 ENCOUNTER — Telehealth: Payer: Self-pay | Admitting: Obstetrics & Gynecology

## 2018-11-18 ENCOUNTER — Ambulatory Visit (INDEPENDENT_AMBULATORY_CARE_PROVIDER_SITE_OTHER): Payer: Medicaid Other

## 2018-11-18 ENCOUNTER — Other Ambulatory Visit: Payer: Self-pay

## 2018-11-18 ENCOUNTER — Other Ambulatory Visit: Payer: Self-pay | Admitting: Obstetrics & Gynecology

## 2018-11-18 VITALS — BP 130/90 | Ht 67.0 in | Wt 198.0 lb

## 2018-11-18 DIAGNOSIS — N921 Excessive and frequent menstruation with irregular cycle: Secondary | ICD-10-CM

## 2018-11-18 DIAGNOSIS — N83202 Unspecified ovarian cyst, left side: Secondary | ICD-10-CM

## 2018-11-18 DIAGNOSIS — R102 Pelvic and perineal pain: Secondary | ICD-10-CM | POA: Diagnosis not present

## 2018-11-18 DIAGNOSIS — N946 Dysmenorrhea, unspecified: Secondary | ICD-10-CM | POA: Diagnosis not present

## 2018-11-18 DIAGNOSIS — N920 Excessive and frequent menstruation with regular cycle: Secondary | ICD-10-CM | POA: Diagnosis not present

## 2018-11-18 LAB — CYTOLOGY - PAP
Comment: NEGATIVE
Diagnosis: NEGATIVE
High risk HPV: NEGATIVE

## 2018-11-18 MED ORDER — METRONIDAZOLE 500 MG PO TABS: 2000.0000 mg | ORAL_TABLET | Freq: Once | ORAL | 0 refills | Status: AC

## 2018-11-18 NOTE — Telephone Encounter (Signed)
Patient is aware of COVID testing tomorrow morning 8-10:30am at [facility], Pre-admit testing phone interview tomorrow afternoon, and OR on 11/23/18. Patient is aware she will be asked to quarantine after COVID testing. Patient is aware she may receive calls from the [place] and [facility]. Patient confirmed Medicaid. Patient was given the phone# to [facility] to call on Monday afternoon for arrival time/instructions.

## 2018-11-18 NOTE — Progress Notes (Signed)
Let her know PAP normal but infection found.  Needs to take ABX prior to surgery.  Rx called in now.  (Trich, Flagyl 2g one dose therapy)

## 2018-11-18 NOTE — Progress Notes (Signed)
  HPI: Pt continues with significant pain in lower pelvis, L>R but diffuse, some help w Meloxicam but not complete, no radiation, associated w bleeding irregularly and heavily at times.    Ultrasound demonstrates cyst seen see bleow  PMHx: She  has a past medical history of Ovarian cyst. Also,  has a past surgical history that includes Tubal ligation; Cholecystectomy; Knee surgery (Right); Hand surgery (Right); Foot surgery (Left); Cesarean section; and Tubal ligation., family history is not on file.,  reports that she has been smoking cigarettes. She has been smoking about 0.50 packs per day. She has never used smokeless tobacco. She reports that she does not drink alcohol or use drugs.  She has a current medication list which includes the following prescription(s): acetaminophen, doxycycline, fluticasone, medroxyprogesterone, meloxicam, mupirocin ointment, albuterol, dicyclomine, metoclopramide, promethazine, and ranitidine. Also, is allergic to penicillins and tramadol.  Review of Systems  All other systems reviewed and are negative.   Objective: BP 130/90   Ht 5\' 7"  (1.702 m)   Wt 198 lb (89.8 kg)   LMP 10/23/2018   BMI 31.01 kg/m   Physical examination Constitutional NAD, Conversant  Skin No rashes, lesions or ulceration.   Extremities: Moves all appropriately.  Normal ROM for age. No lymphadenopathy.  Neuro: Grossly intact  Psych: Oriented to PPT.  Normal mood. Normal affect.   US Pelvic Complete With Transvaginal  Result Date: 11/18/2018 Patient Name: Holly Berry DOB: 1985/03/04 MRN: [id number] ULTRASOUND REPORT Location: [facility] Date of Service: 11/18/2018 Indications:Abnormal Uterine Bleeding Findings: The uterus is anteverted and measures 10.3 x 7.0 x 5.7 cm. Echo texture is homogenous without evidence of focal masses. The Endometrium measures 15.6 mm. Right Ovary measures 3.0 x 2.4 x 1.9 cm. It is normal in appearance. Left Ovary measures 5.7 x 5.2 x 4.3 cm.  There is a simple cyst in the left ovary measuring 48 x 39 x 51 mm Survey of the adnexa demonstrates no adnexal masses. There is no free fluid in the cul de sac. Impression: 1. The endometrium is thick and heterogeneous. 2. The uterus and cervix are otherwise normal in appearance. 3. Normal right ovary. 4. There is a 5cm simple cyst in the left ovary. Recommendations: 1.Clinical correlation with the patient's History and Physical Exam. Gweneth Dimitri, RT Review of ULTRASOUND.    I have personally reviewed images and report of recent ultrasound done at [facility].    Plan of management to be discussed with patient. Barnett Applebaum, MD, [facility], [facility] 11/18/2018  10:54 AM   Assessment:  Left ovarian cyst  Pelvic pain  Menometrorrhagia  Options discussed, desires surgery to remove cyst, improve pain, assess for endometriosis, and D&C for bleeding Pros and cons of surgery discussed, recovery. Consents today H&P separately notated  Barnett Applebaum, MD, [facility], [facility] 11/18/2018  11:03 AM

## 2018-11-18 NOTE — Progress Notes (Signed)
PRE-OPERATIVE HISTORY AND PHYSICAL EXAM  HPI:  Holly Berry is a 33 y.o. [id number] Patient's last menstrual period was 10/23/2018.; she is being admitted for surgery related to abnormal uterine bleeding and pelvic pain.  Pt has notes 5 cm left ovarian cyst by [place], found when she presented with severe diffuse yet L>R lower pelvic pains without radiation and associated with menometrorrhagia, Meloxicam limited help.  Hormones in past no help w periods or had side effects. Pt is a smoker.  Desires surgery to remove cyst.  Has past h/o cysts, no surgery for them in past.  PMHx: Past Medical History:  Diagnosis Date  . Ovarian cyst    Past Surgical History:  Procedure Laterality Date  . CESAREAN SECTION    . CHOLECYSTECTOMY    . FOOT SURGERY Left   . HAND SURGERY Right   . KNEE SURGERY Right   . TUBAL LIGATION    . TUBAL LIGATION     History reviewed. No pertinent family history. Social History   Tobacco Use  . Smoking status: Current Every Day Smoker    Packs/day: 0.50    Types: Cigarettes  . Smokeless tobacco: Never Used  Substance Use Topics  . Alcohol use: No  . Drug use: No    Current Outpatient Medications:  .  acetaminophen (TYLENOL) 500 MG tablet, Take by mouth., Disp: , Rfl:  .  doxycycline (MONODOX) 100 MG capsule, Take 100 mg by mouth 2 (two) times daily., Disp: , Rfl:  .  fluticasone (FLONASE) 50 MCG/ACT nasal spray, Place 2 sprays into both nostrils daily., Disp: 16 g, Rfl: 0 .  medroxyPROGESTERone (PROVERA) 10 MG tablet, TAKE ONE TABLET BY MOUTH ONCE DAILY FOR 10 DAYS, THEN DISCONTINUE, Disp: , Rfl:  .  meloxicam (MOBIC) 7.5 MG tablet, Take 1 tablet (7.5 mg total) by mouth daily., Disp: 15 tablet, Rfl: 0 .  mupirocin ointment (BACTROBAN) 2 %, Apply two times a day for 7 days., Disp: 22 g, Rfl: 0 Allergies: Penicillins and Tramadol  Review of Systems  Constitutional: Negative for chills, fever and malaise/fatigue.  HENT: Negative for congestion, sinus pain and  sore throat.   Eyes: Negative for blurred vision and pain.  Respiratory: Negative for cough and wheezing.   Cardiovascular: Negative for chest pain and leg swelling.  Gastrointestinal: Negative for abdominal pain, constipation, diarrhea, heartburn, nausea and vomiting.  Genitourinary: Negative for dysuria, frequency, hematuria and urgency.  Musculoskeletal: Negative for back pain, joint pain, myalgias and neck pain.  Skin: Negative for itching and rash.  Neurological: Negative for dizziness, tremors and weakness.  Endo/Heme/Allergies: Does not bruise/bleed easily.  Psychiatric/Behavioral: Negative for depression. The patient is not nervous/anxious and does not have insomnia.     Objective: BP 130/90   Ht 5\' 7"  (1.702 m)   Wt 198 lb (89.8 kg)   LMP 10/23/2018   BMI 31.01 kg/m   Filed Weights   11/18/18 1027  Weight: 198 lb (89.8 kg)   Physical Exam Constitutional:      General: She is not in acute distress.    Appearance: She is well-developed.  HENT:     Head: Normocephalic and atraumatic. No laceration.     Right Ear: Hearing normal.     Left Ear: Hearing normal.     Mouth/Throat:     Pharynx: Uvula midline.  Eyes:     Pupils: Pupils are equal, round, and reactive to light.  Neck:     Musculoskeletal: Normal range of motion  and neck supple.     Thyroid: No thyromegaly.  Cardiovascular:     Rate and Rhythm: Normal rate and regular rhythm.     Heart sounds: No murmur. No friction rub. No gallop.   Pulmonary:     Effort: Pulmonary effort is normal. No respiratory distress.     Breath sounds: Normal breath sounds. No wheezing.  Chest:     Breasts:        Right: No mass, skin change or tenderness.        Left: No mass, skin change or tenderness.  Abdominal:     General: Bowel sounds are normal. There is no distension.     Palpations: Abdomen is soft.     Tenderness: There is no abdominal tenderness. There is no rebound.  Musculoskeletal: Normal range of motion.   Neurological:     Mental Status: She is alert and oriented to person, place, and time.     Cranial Nerves: No cranial nerve deficit.  Skin:    General: Skin is warm and dry.  Psychiatric:        Judgment: Judgment normal.  Vitals signs reviewed.     Assessment: 1. Left ovarian cyst   2. Pelvic pain   3. Menometrorrhagia   Plan Laparoscopy, Left Ovarian Cystectomy, Evaluation for endometriosis, and D&C (if still bleeding as she is today)  I have had a careful discussion with this patient about all the options available and the risk/benefits of each. I have fully informed this patient that surgery may subject her to a variety of discomforts and risks: She understands that most patients have surgery with little difficulty, but problems can happen ranging from minor to fatal. These include nausea, vomiting, pain, bleeding, infection, poor healing, hernia, or formation of adhesions. Unexpected reactions may occur from any drug or anesthetic given. Unintended injury may occur to other pelvic or abdominal structures such as Fallopian tubes, ovaries, bladder, ureter (tube from kidney to bladder), or bowel. Nerves going from the pelvis to the legs may be injured. Any such injury may require immediate or later additional surgery to correct the problem. Excessive blood loss requiring transfusion is very unlikely but possible. Dangerous blood clots may form in the legs or lungs. Physical and sexual activity will be restricted in varying degrees for an indeterminate period of time but most often 2-6 weeks.  Finally, she understands that it is impossible to list every possible undesirable effect and that the condition for which surgery is done is not always cured or significantly improved, and in rare cases may be even worse.Ample time was given to answer all questions.  Annamarie Major, MD, [facility], [facility] 11/18/2018  11:06 AM

## 2018-11-18 NOTE — Telephone Encounter (Signed)
-----   Message from Gae Dry, MD sent at 11/18/2018 10:49 AM EDT ----- Regarding: Surgery soon Oct 27 Surgery Booking Request Patient Full Name:  Holly Berry  MRN: [id number]  DOB: March 09, 1985  Surgeon: Hoyt Koch, MD  Requested Surgery Date and Time: 11/23/18 Primary Diagnosis AND Code: Left Ovarian Cyst, Pelvic pain (R10.2, N83.202), menometrorrhagia (N92.1) Surgical Procedure: Laparoscopy with Ovarian Cystectomy, D&C L&D Notification: No Admission Status: same day surgery Length of Surgery: 45 min Special Case Needs: No H&P: done Phone Interview???:  Yes Interpreter: No Language:  Medical Clearance:  No Special Scheduling Instructions: none Any known health/anesthesia issues, diabetes, sleep apnea, latex allergy, defibrillator/pacemaker?: No Acuity: P2   (P1 highest, P2 delay may cause harm, P3 low, elective gyn, P4 lowest)

## 2018-11-18 NOTE — Progress Notes (Signed)
Pt aware.

## 2018-11-18 NOTE — Patient Instructions (Addendum)
PRE ADMISSION TESTING For Covid, prior to procedure Friday 9:00-10:00 [facility] entrance (drive up)  Results in 48-72 hours You will not receive notification if test results are negative. If positive for Covid19, your provider will notify you by phone, with additional instructions.   Ovarian Cystectomy  Ovarian cystectomy is a procedure that is done to remove a fluid-filled sac (cyst) on an ovary. The ovaries are small organs that produce eggs in women. Various types of cysts can form on the ovaries. Most are not cancerous. This procedure may be done for cysts that are large, cause symptoms, or do not go away on their own. It may also be done for a cyst that is cancerous or might be cancerous. This surgery can be done using a laparoscopic technique or an open abdominal technique. The laparoscopic technique involves smaller incisions and results in a faster recovery time. The technique used will depend on your age, the type of cyst that you have, and whether the cyst is cancerous. The laparoscopic technique is not used for a cancerous cyst. Tell a health care provider about:  Any allergies you have.  All medicines you are taking, including vitamins, herbs, eye drops, creams, and over-the-counter medicines.  Any problems you or family members have had with the use of anesthetic medicines.  Any blood disorders you have.  Any surgeries you have had.  Any medical conditions you have.  Whether you are pregnant or may be pregnant. What are the risks? Generally, this is a safe procedure. However, problems may occur, including:  Excessive bleeding.  Infection.  Damage to other structures or organs.  Blood clots.  Inability get pregnant (infertility). What happens before the procedure? Staying hydrated Follow instructions from your health care provider about hydration, which may include:  Up to 2 hours before the procedure - you may continue to drink clear liquids, such  as water, clear fruit juice, black coffee, and plain tea. Eating and drinking restrictions Follow instructions from your health care provider about eating and drinking, which may include:  8 hours before the procedure - stop eating and drinking everything except clear liquids.  6 hours before the procedure - stop eating light meals or foods, such as toast or cereal.  6 hours before the procedure - stop drinking milk or drinks that contain milk.  2 hours before the procedure - stop drinking clear liquids. Medicines  Ask your health care provider about: ? Changing or stopping your regular medicines. This is especially important if you are taking diabetes medicines or blood thinners. ? Taking medicines such as aspirin and ibuprofen. These medicines can thin your blood. Do not take these medicines before your procedure if your health care provider instructs you not to.  You may be given antibiotic medicine to help prevent infection. General instructions  Do not use any products that contain nicotine or tobacco, such as cigarettes and e-cigarettes, for 2 weeks before the procedure. If you need help quitting, ask your health care provider.  Ask your health care provider how your surgical site will be marked or identified.  Plan to have someone take you home from the hospital or clinic.  Plan to have someone help with household activities for 1-2 weeks after the procedure.  You may be asked to shower with a germ-killing soap.  Let your health care provider know if you develop a cold or any infection before your surgery. What happens during the procedure?  To reduce your risk of infection: ? Your  health care team will wash or sanitize their hands. ? Your skin will be washed with soap. ? Hair may be removed from the surgical area.  An IV will be inserted into one of your veins.  You will be given one or more of the following: ? A medicine to help you relax (sedative). ? A medicine to  make you fall asleep (general anesthetic).  Small monitors will be attached to your body. They will be used to check your heart, blood pressure, and oxygen level.  A breathing tube will be placed into your lungs during the procedure.  Your surgeon will use one of the following methods to perform the surgery: Laparoscopic technique  Several small incisions will be made in your abdomen. These will typically be about 1 to 2 cm long.  Your abdomen will be filled with carbon dioxide gas to make it expand. This will give the surgeon more room to operate. It will also make your organs easier to see.  A thin, lighted tube with a camera (laparoscope) will be put through one of the small incisions. The laparoscope will send a picture to a TV screen in the operating room. This will give the surgeon a good view of your organs.  Hollow tubes will be put through the other small incisions in your abdomen. The tools needed for the procedure will be put through these tubes.  The ovary with the cyst will be identified, and the cyst will be removed. It will then be sent to the lab for testing. If the cyst has cancer cells, both ovaries may need to be removed during a different surgery.  Tools will be removed. The incisions will then be closed with stitches or skin glue. Bandages (dressings) may be applied. Open abdominal technique  A single, large incision will be made along your bikini line or in the middle of your lower abdomen.  The ovary with the cyst will be identified, and the cyst will be removed. It will then be sent to the lab for testing. If the cyst has cancer cells, both ovaries may need to be removed during a different surgery.  The incision will then be closed with stitches or staples.  Bandages (dressings) may be applied. The procedure may vary among health care providers and hospitals. What happens after the procedure?  Your blood pressure, heart rate, breathing rate, and blood oxygen  level will be monitored until the medicines you were given have worn off.  Your IV access will be removed after you are able to eat and drink well.  You may be given medicine for pain or to help you sleep.  You may be given an antibiotic medicine.  Do not drive for 24 hours if you were given a sedative. Summary  Ovarian cystectomy is a procedure that is done to remove a cyst on an ovary.  This procedure may be done for cysts that are large, cause symptoms, or do not go away on their own. It may also be done for a cyst that is cancerous or might be cancerous.  Follow instructions from your health care provider about eating and drinking before the procedure.  After the cyst is removed, it will be sent to the lab for testing. This information is not intended to replace advice given to you by your health care provider. Make sure you discuss any questions you have with your health care provider. Document Released: 11/10/2006 Document Revised: 12/26/2016 Document Reviewed: 03/04/2016 Elsevier Patient Education  2020  [employer].

## 2018-11-18 NOTE — H&P (View-Only) (Signed)
PRE-OPERATIVE HISTORY AND PHYSICAL EXAM  HPI:  Holly Berry is a 33 y.o. [id number] Patient's last menstrual period was 10/23/2018.; she is being admitted for surgery related to abnormal uterine bleeding and pelvic pain.  Pt has notes 5 cm left ovarian cyst by [place], found when she presented with severe diffuse yet L>R lower pelvic pains without radiation and associated with menometrorrhagia, Meloxicam limited help.  Hormones in past no help w periods or had side effects. Pt is a smoker.  Desires surgery to remove cyst.  Has past h/o cysts, no surgery for them in past.  PMHx: Past Medical History:  Diagnosis Date  . Ovarian cyst    Past Surgical History:  Procedure Laterality Date  . CESAREAN SECTION    . CHOLECYSTECTOMY    . FOOT SURGERY Left   . HAND SURGERY Right   . KNEE SURGERY Right   . TUBAL LIGATION    . TUBAL LIGATION     History reviewed. No pertinent family history. Social History   Tobacco Use  . Smoking status: Current Every Day Smoker    Packs/day: 0.50    Types: Cigarettes  . Smokeless tobacco: Never Used  Substance Use Topics  . Alcohol use: No  . Drug use: No    Current Outpatient Medications:  .  acetaminophen (TYLENOL) 500 MG tablet, Take by mouth., Disp: , Rfl:  .  doxycycline (MONODOX) 100 MG capsule, Take 100 mg by mouth 2 (two) times daily., Disp: , Rfl:  .  fluticasone (FLONASE) 50 MCG/ACT nasal spray, Place 2 sprays into both nostrils daily., Disp: 16 g, Rfl: 0 .  medroxyPROGESTERone (PROVERA) 10 MG tablet, TAKE ONE TABLET BY MOUTH ONCE DAILY FOR 10 DAYS, THEN DISCONTINUE, Disp: , Rfl:  .  meloxicam (MOBIC) 7.5 MG tablet, Take 1 tablet (7.5 mg total) by mouth daily., Disp: 15 tablet, Rfl: 0 .  mupirocin ointment (BACTROBAN) 2 %, Apply two times a day for 7 days., Disp: 22 g, Rfl: 0 Allergies: Penicillins and Tramadol  Review of Systems  Constitutional: Negative for chills, fever and malaise/fatigue.  HENT: Negative for congestion, sinus pain and  sore throat.   Eyes: Negative for blurred vision and pain.  Respiratory: Negative for cough and wheezing.   Cardiovascular: Negative for chest pain and leg swelling.  Gastrointestinal: Negative for abdominal pain, constipation, diarrhea, heartburn, nausea and vomiting.  Genitourinary: Negative for dysuria, frequency, hematuria and urgency.  Musculoskeletal: Negative for back pain, joint pain, myalgias and neck pain.  Skin: Negative for itching and rash.  Neurological: Negative for dizziness, tremors and weakness.  Endo/Heme/Allergies: Does not bruise/bleed easily.  Psychiatric/Behavioral: Negative for depression. The patient is not nervous/anxious and does not have insomnia.     Objective: BP 130/90   Ht 5\' 7"  (1.702 m)   Wt 198 lb (89.8 kg)   LMP 10/23/2018   BMI 31.01 kg/m   Filed Weights   11/18/18 1027  Weight: 198 lb (89.8 kg)   Physical Exam Constitutional:      General: She is not in acute distress.    Appearance: She is well-developed.  HENT:     Head: Normocephalic and atraumatic. No laceration.     Right Ear: Hearing normal.     Left Ear: Hearing normal.     Mouth/Throat:     Pharynx: Uvula midline.  Eyes:     Pupils: Pupils are equal, round, and reactive to light.  Neck:     Musculoskeletal: Normal range of motion  and neck supple.     Thyroid: No thyromegaly.  Cardiovascular:     Rate and Rhythm: Normal rate and regular rhythm.     Heart sounds: No murmur. No friction rub. No gallop.   Pulmonary:     Effort: Pulmonary effort is normal. No respiratory distress.     Breath sounds: Normal breath sounds. No wheezing.  Chest:     Breasts:        Right: No mass, skin change or tenderness.        Left: No mass, skin change or tenderness.  Abdominal:     General: Bowel sounds are normal. There is no distension.     Palpations: Abdomen is soft.     Tenderness: There is no abdominal tenderness. There is no rebound.  Musculoskeletal: Normal range of motion.   Neurological:     Mental Status: She is alert and oriented to person, place, and time.     Cranial Nerves: No cranial nerve deficit.  Skin:    General: Skin is warm and dry.  Psychiatric:        Judgment: Judgment normal.  Vitals signs reviewed.     Assessment: 1. Left ovarian cyst   2. Pelvic pain   3. Menometrorrhagia   Plan Laparoscopy, Left Ovarian Cystectomy, Evaluation for endometriosis, and D&C (if still bleeding as she is today)  I have had a careful discussion with this patient about all the options available and the risk/benefits of each. I have fully informed this patient that surgery may subject her to a variety of discomforts and risks: She understands that most patients have surgery with little difficulty, but problems can happen ranging from minor to fatal. These include nausea, vomiting, pain, bleeding, infection, poor healing, hernia, or formation of adhesions. Unexpected reactions may occur from any drug or anesthetic given. Unintended injury may occur to other pelvic or abdominal structures such as Fallopian tubes, ovaries, bladder, ureter (tube from kidney to bladder), or bowel. Nerves going from the pelvis to the legs may be injured. Any such injury may require immediate or later additional surgery to correct the problem. Excessive blood loss requiring transfusion is very unlikely but possible. Dangerous blood clots may form in the legs or lungs. Physical and sexual activity will be restricted in varying degrees for an indeterminate period of time but most often 2-6 weeks.  Finally, she understands that it is impossible to list every possible undesirable effect and that the condition for which surgery is done is not always cured or significantly improved, and in rare cases may be even worse.Ample time was given to answer all questions.  Paul Brenan [place], MD, FACOG [facility], [facility] 11/18/2018  11:06 AM  

## 2018-11-19 ENCOUNTER — Encounter
Admission: RE | Admit: 2018-11-19 | Discharge: 2018-11-19 | Disposition: A | Discharge status: Home / Self Care | Payer: Medicaid Other | Source: Ambulatory Visit | Attending: Obstetrics & Gynecology | Admitting: Obstetrics & Gynecology

## 2018-11-19 ENCOUNTER — Telehealth: Payer: Self-pay

## 2018-11-19 ENCOUNTER — Other Ambulatory Visit
Admission: RE | Admit: 2018-11-19 | Discharge: 2018-11-19 | Disposition: A | Discharge status: Home / Self Care | Payer: Medicaid Other | Source: Ambulatory Visit | Attending: Obstetrics & Gynecology | Admitting: Obstetrics & Gynecology

## 2018-11-19 ENCOUNTER — Other Ambulatory Visit: Payer: Self-pay | Admitting: Maternal Newborn

## 2018-11-19 DIAGNOSIS — N83202 Unspecified ovarian cyst, left side: Secondary | ICD-10-CM | POA: Diagnosis not present

## 2018-11-19 DIAGNOSIS — Z791 Long term (current) use of non-steroidal anti-inflammatories (NSAID): Secondary | ICD-10-CM | POA: Diagnosis not present

## 2018-11-19 DIAGNOSIS — Z20828 Contact with and (suspected) exposure to other viral communicable diseases: Secondary | ICD-10-CM | POA: Diagnosis not present

## 2018-11-19 DIAGNOSIS — A599 Trichomoniasis, unspecified: Secondary | ICD-10-CM

## 2018-11-19 DIAGNOSIS — Z8614 Personal history of Methicillin resistant Staphylococcus aureus infection: Secondary | ICD-10-CM | POA: Diagnosis not present

## 2018-11-19 DIAGNOSIS — N921 Excessive and frequent menstruation with irregular cycle: Secondary | ICD-10-CM | POA: Diagnosis present

## 2018-11-19 DIAGNOSIS — Z01812 Encounter for preprocedural laboratory examination: Secondary | ICD-10-CM | POA: Diagnosis not present

## 2018-11-19 DIAGNOSIS — F17210 Nicotine dependence, cigarettes, uncomplicated: Secondary | ICD-10-CM | POA: Diagnosis not present

## 2018-11-19 DIAGNOSIS — N920 Excessive and frequent menstruation with regular cycle: Secondary | ICD-10-CM | POA: Diagnosis not present

## 2018-11-19 DIAGNOSIS — R102 Pelvic and perineal pain: Secondary | ICD-10-CM | POA: Diagnosis not present

## 2018-11-19 LAB — CBC
HCT: 40.2 % (ref 36.0–46.0)
Hemoglobin: 13 g/dL (ref 12.0–15.0)
MCH: 27.7 pg (ref 26.0–34.0)
MCHC: 32.3 g/dL (ref 30.0–36.0)
MCV: 85.7 fL (ref 80.0–100.0)
Platelets: 318 10*3/uL (ref 150–400)
RBC: 4.69 MIL/uL (ref 3.87–5.11)
RDW: 14.2 % (ref 11.5–15.5)
WBC: 9.2 10*3/uL (ref 4.0–10.5)
nRBC: 0 % (ref 0.0–0.2)

## 2018-11-19 LAB — TYPE AND SCREEN
ABO/RH(D): B POS
Antibody Screen: NEGATIVE
Extend sample reason: TRANSFUSED

## 2018-11-19 LAB — SARS CORONAVIRUS 2 BY RT PCR (HOSPITAL ORDER, PERFORMED IN ~~LOC~~ HOSPITAL LAB): SARS Coronavirus 2: NEGATIVE

## 2018-11-19 MED ORDER — METRONIDAZOLE 500 MG PO TABS: 2000.0000 mg | ORAL_TABLET | Freq: Once | ORAL | 0 refills | Status: AC

## 2018-11-19 NOTE — Patient Instructions (Signed)
Your procedure is scheduled on: Tues 10/27 Report to Day Surgery. To find out your arrival time please call [phone] between 1PM - 3PM on Mon 10/26.  Remember: Instructions that are not followed completely may result in serious medical risk,  up to and including death, or upon the discretion of your surgeon and anesthesiologist your  surgery may need to be rescheduled.     _X__ 1. Do not eat food after midnight the night before your procedure.                 No gum chewing or hard candies. You may drink clear liquids up to 2 hours                 before you are scheduled to arrive for your surgery- DO not drink clear                 liquids within 2 hours of the start of your surgery.                 Clear Liquids include:  water, apple juice without pulp, clear carbohydrate                 drink such as Clearfast of Gatorade, Black Coffee or Tea (Do not add                 anything to coffee or tea).  __X__2.  On the morning of surgery brush your teeth with toothpaste and water, you                may rinse your mouth with mouthwash if you wish.  Do not swallow any toothpaste of mouthwash.     _X__ 3.  No Alcohol for 24 hours before or after surgery.   _X__ 4.  Do Not Smoke or use e-cigarettes For 24 Hours Prior to Your Surgery.                 Do not use any chewable tobacco products for at least 6 hours prior to                 surgery.  ____  5.  Bring all medications with you on the day of surgery if instructed.   __x__  6.  Notify your doctor if there is any change in your medical condition      (cold, fever, infections).     Do not wear jewelry, make-up, hairpins, clips or nail polish. Do not wear lotions, powders, or perfumes. You may wear deodorant. Do not shave 48 hours prior to surgery. Men may shave face and neck. Do not bring valuables to the hospital.    [facility] is not responsible for any belongings or valuables.  Contacts, dentures  or bridgework may not be worn into surgery. Leave your suitcase in the car. After surgery it may be brought to your room. For patients admitted to the hospital, discharge time is determined by your treatment team.   Patients discharged the day of surgery will not be allowed to drive home.   Please read over the following fact sheets that you were given:     __x__ Take these medicines the morning of surgery with A SIP OF WATER:    1. none  2.   3.   4.  5.  6.  ____ Fleet Enema (as directed)   __x__ Use CHG Soap as directed  __x__ Use inhalers on the day  of surgery  ____ Stop metformin 2 days prior to surgery    ____ Take 1/2 of usual insulin dose the night before surgery. No insulin the morning          of surgery.   ____ Stop Coumadin/Plavix/aspirin on   __x__ Stop Anti-inflammatories meloxicam (MOBIC) 7.5 MG tablet, Ibuprofen-Acetaminophen (ADVIL DUAL ACTION) 125-250 MG TABS  Today .  May take tylenol   ____ Stop supplements until after surgery.    ____ Bring C-Pap to the hospital.

## 2018-11-19 NOTE — Progress Notes (Signed)
Rx for trichomoniasis partner therapy.

## 2018-11-19 NOTE — Telephone Encounter (Signed)
Can you send in a refill for the pts partner for trichmonas

## 2018-11-19 NOTE — Telephone Encounter (Signed)
Rx sent, thanks 

## 2018-11-23 ENCOUNTER — Ambulatory Visit: Payer: Medicaid Other | Admitting: Anesthesiology

## 2018-11-23 ENCOUNTER — Ambulatory Visit
Admission: RE | Admit: 2018-11-23 | Discharge: 2018-11-23 | Disposition: A | Discharge status: Home / Self Care | Payer: Medicaid Other | Attending: Obstetrics & Gynecology | Admitting: Obstetrics & Gynecology

## 2018-11-23 ENCOUNTER — Other Ambulatory Visit: Payer: Self-pay

## 2018-11-23 ENCOUNTER — Encounter
Admission: RE | Disposition: A | Discharge status: Home / Self Care | Payer: Self-pay | Source: Home / Self Care | Attending: Obstetrics & Gynecology

## 2018-11-23 DIAGNOSIS — F17210 Nicotine dependence, cigarettes, uncomplicated: Secondary | ICD-10-CM | POA: Insufficient documentation

## 2018-11-23 DIAGNOSIS — N83202 Unspecified ovarian cyst, left side: Secondary | ICD-10-CM | POA: Insufficient documentation

## 2018-11-23 DIAGNOSIS — Z791 Long term (current) use of non-steroidal anti-inflammatories (NSAID): Secondary | ICD-10-CM | POA: Insufficient documentation

## 2018-11-23 DIAGNOSIS — N920 Excessive and frequent menstruation with regular cycle: Secondary | ICD-10-CM | POA: Insufficient documentation

## 2018-11-23 DIAGNOSIS — R102 Pelvic and perineal pain: Secondary | ICD-10-CM | POA: Insufficient documentation

## 2018-11-23 DIAGNOSIS — Z8614 Personal history of Methicillin resistant Staphylococcus aureus infection: Secondary | ICD-10-CM | POA: Insufficient documentation

## 2018-11-23 DIAGNOSIS — N921 Excessive and frequent menstruation with irregular cycle: Secondary | ICD-10-CM | POA: Diagnosis present

## 2018-11-23 DIAGNOSIS — Z20828 Contact with and (suspected) exposure to other viral communicable diseases: Secondary | ICD-10-CM | POA: Insufficient documentation

## 2018-11-23 DIAGNOSIS — N8302 Follicular cyst of left ovary: Secondary | ICD-10-CM

## 2018-11-23 HISTORY — PX: HYSTEROSCOPY WITH D & C: SHX1775

## 2018-11-23 HISTORY — PX: LAPAROSCOPIC OVARIAN CYSTECTOMY: SHX6248

## 2018-11-23 LAB — POCT PREGNANCY, URINE: Preg Test, Ur: NEGATIVE

## 2018-11-23 LAB — TYPE AND SCREEN
ABO/RH(D): B POS
Antibody Screen: NEGATIVE

## 2018-11-23 SURGERY — EXCISION, CYST, OVARY, LAPAROSCOPIC
Anesthesia: General

## 2018-11-23 MED ORDER — MIDAZOLAM HCL 2 MG/2ML IJ SOLN
INTRAMUSCULAR | Status: DC | PRN
  Administered 2018-11-23: 2 mg via INTRAVENOUS

## 2018-11-23 MED ORDER — ACETAMINOPHEN 325 MG PO TABS: 650.0000 mg | ORAL_TABLET | ORAL | Status: DC | PRN

## 2018-11-23 MED ORDER — OXYCODONE HCL 5 MG PO TABS
ORAL_TABLET | ORAL | Status: AC
  Filled 2018-11-23: qty 1

## 2018-11-23 MED ORDER — SUCCINYLCHOLINE CHLORIDE 20 MG/ML IJ SOLN
INTRAMUSCULAR | Status: DC | PRN
  Administered 2018-11-23: 100 mg via INTRAVENOUS

## 2018-11-23 MED ORDER — ONDANSETRON HCL 4 MG/2ML IJ SOLN
INTRAMUSCULAR | Status: AC
  Filled 2018-11-23: qty 6

## 2018-11-23 MED ORDER — GLYCOPYRROLATE 0.2 MG/ML IJ SOLN
INTRAMUSCULAR | Status: AC
  Filled 2018-11-23: qty 3

## 2018-11-23 MED ORDER — MIDAZOLAM HCL 2 MG/2ML IJ SOLN
INTRAMUSCULAR | Status: AC
  Filled 2018-11-23: qty 2

## 2018-11-23 MED ORDER — OXYCODONE-ACETAMINOPHEN 5-325 MG PO TABS: 1.0000 | ORAL_TABLET | ORAL | Status: DC | PRN

## 2018-11-23 MED ORDER — LACTATED RINGERS IV SOLN
INTRAVENOUS | Status: DC | PRN
  Administered 2018-11-23: 07:00:00 via INTRAVENOUS

## 2018-11-23 MED ORDER — BUPIVACAINE HCL (PF) 0.5 % IJ SOLN
INTRAMUSCULAR | Status: AC
  Filled 2018-11-23: qty 30

## 2018-11-23 MED ORDER — ROCURONIUM BROMIDE 50 MG/5ML IV SOLN
INTRAVENOUS | Status: AC
  Filled 2018-11-23: qty 3

## 2018-11-23 MED ORDER — FENTANYL CITRATE (PF) 100 MCG/2ML IJ SOLN: 25.0000 ug | INTRAMUSCULAR | Status: DC | PRN

## 2018-11-23 MED ORDER — FENTANYL CITRATE (PF) 100 MCG/2ML IJ SOLN
INTRAMUSCULAR | Status: AC
  Administered 2018-11-23: 25 ug via INTRAVENOUS
  Filled 2018-11-23: qty 2

## 2018-11-23 MED ORDER — LACTATED RINGERS IV SOLN
INTRAVENOUS | Status: DC
  Administered 2018-11-23: 07:00:00 via INTRAVENOUS

## 2018-11-23 MED ORDER — PROPOFOL 500 MG/50ML IV EMUL
INTRAVENOUS | Status: AC
  Filled 2018-11-23: qty 50

## 2018-11-23 MED ORDER — FENTANYL CITRATE (PF) 100 MCG/2ML IJ SOLN
INTRAMUSCULAR | Status: AC
  Filled 2018-11-23: qty 2

## 2018-11-23 MED ORDER — KETOROLAC TROMETHAMINE 30 MG/ML IJ SOLN
30.0000 mg | Freq: Once | INTRAMUSCULAR | Status: AC
  Administered 2018-11-23: 09:00:00 30 mg via INTRAVENOUS

## 2018-11-23 MED ORDER — DEXMEDETOMIDINE HCL IN NACL 80 MCG/20ML IV SOLN
INTRAVENOUS | Status: AC
  Filled 2018-11-23: qty 20

## 2018-11-23 MED ORDER — FENTANYL CITRATE (PF) 100 MCG/2ML IJ SOLN
INTRAMUSCULAR | Status: DC | PRN
  Administered 2018-11-23 (×2): 50 ug via INTRAVENOUS

## 2018-11-23 MED ORDER — GLYCOPYRROLATE 0.2 MG/ML IJ SOLN
INTRAMUSCULAR | Status: DC | PRN
  Administered 2018-11-23: 0.2 mg via INTRAVENOUS

## 2018-11-23 MED ORDER — BUPIVACAINE HCL (PF) 0.5 % IJ SOLN
INTRAMUSCULAR | Status: DC | PRN
  Administered 2018-11-23: 10 mL

## 2018-11-23 MED ORDER — PROMETHAZINE HCL 25 MG/ML IJ SOLN: 6.2500 mg | INTRAMUSCULAR | Status: DC | PRN

## 2018-11-23 MED ORDER — FAMOTIDINE 20 MG PO TABS
20.0000 mg | ORAL_TABLET | Freq: Once | ORAL | Status: AC
  Administered 2018-11-23: 20 mg via ORAL

## 2018-11-23 MED ORDER — DEXMEDETOMIDINE HCL IN NACL 200 MCG/50ML IV SOLN
INTRAVENOUS | Status: DC | PRN
  Administered 2018-11-23: 8 ug via INTRAVENOUS
  Administered 2018-11-23: 20 ug via INTRAVENOUS
  Administered 2018-11-23: 4 ug via INTRAVENOUS

## 2018-11-23 MED ORDER — LIDOCAINE HCL (PF) 2 % IJ SOLN
INTRAMUSCULAR | Status: AC
  Filled 2018-11-23: qty 20

## 2018-11-23 MED ORDER — LACTATED RINGERS IV SOLN: INTRAVENOUS | Status: DC

## 2018-11-23 MED ORDER — ONDANSETRON HCL 4 MG/2ML IJ SOLN
INTRAMUSCULAR | Status: DC | PRN
  Administered 2018-11-23: 4 mg via INTRAVENOUS

## 2018-11-23 MED ORDER — FAMOTIDINE 20 MG PO TABS
ORAL_TABLET | ORAL | Status: AC
  Filled 2018-11-23: qty 1

## 2018-11-23 MED ORDER — PHENYLEPHRINE HCL (PRESSORS) 10 MG/ML IV SOLN
INTRAVENOUS | Status: AC
  Filled 2018-11-23: qty 1

## 2018-11-23 MED ORDER — SUCCINYLCHOLINE CHLORIDE 20 MG/ML IJ SOLN
INTRAMUSCULAR | Status: AC
  Filled 2018-11-23: qty 2

## 2018-11-23 MED ORDER — ROCURONIUM BROMIDE 100 MG/10ML IV SOLN
INTRAVENOUS | Status: DC | PRN
  Administered 2018-11-23: 50 mg via INTRAVENOUS
  Administered 2018-11-23: 10 mg via INTRAVENOUS

## 2018-11-23 MED ORDER — KETOROLAC TROMETHAMINE 30 MG/ML IJ SOLN
INTRAMUSCULAR | Status: AC
  Administered 2018-11-23: 30 mg via INTRAVENOUS
  Filled 2018-11-23: qty 1

## 2018-11-23 MED ORDER — LIDOCAINE HCL (CARDIAC) PF 100 MG/5ML IV SOSY
PREFILLED_SYRINGE | INTRAVENOUS | Status: DC | PRN
  Administered 2018-11-23: 80 mg via INTRAVENOUS

## 2018-11-23 MED ORDER — OXYCODONE-ACETAMINOPHEN 5-325 MG PO TABS: 1.0000 | ORAL_TABLET | ORAL | 0 refills | Status: DC | PRN

## 2018-11-23 MED ORDER — EPHEDRINE SULFATE 50 MG/ML IJ SOLN
INTRAMUSCULAR | Status: AC
  Filled 2018-11-23: qty 2

## 2018-11-23 MED ORDER — OXYCODONE HCL 5 MG PO TABS
5.0000 mg | ORAL_TABLET | Freq: Once | ORAL | Status: AC | PRN
  Administered 2018-11-23: 5 mg via ORAL

## 2018-11-23 MED ORDER — ACETAMINOPHEN 650 MG RE SUPP
650.0000 mg | RECTAL | Status: DC | PRN
  Filled 2018-11-23: qty 1

## 2018-11-23 MED ORDER — PROPOFOL 10 MG/ML IV BOLUS
INTRAVENOUS | Status: AC
  Filled 2018-11-23: qty 20

## 2018-11-23 MED ORDER — PROPOFOL 10 MG/ML IV BOLUS
INTRAVENOUS | Status: DC | PRN
  Administered 2018-11-23: 20 mg via INTRAVENOUS
  Administered 2018-11-23: 150 mg via INTRAVENOUS
  Administered 2018-11-23: 20 mg via INTRAVENOUS

## 2018-11-23 MED ORDER — SUGAMMADEX SODIUM 500 MG/5ML IV SOLN
INTRAVENOUS | Status: DC | PRN
  Administered 2018-11-23: 360 mg via INTRAVENOUS

## 2018-11-23 MED ORDER — DEXAMETHASONE SODIUM PHOSPHATE 10 MG/ML IJ SOLN
INTRAMUSCULAR | Status: DC | PRN
  Administered 2018-11-23: 10 mg via INTRAVENOUS

## 2018-11-23 MED ORDER — FENTANYL CITRATE (PF) 100 MCG/2ML IJ SOLN
25.0000 ug | INTRAMUSCULAR | Status: DC | PRN
  Administered 2018-11-23 (×4): 25 ug via INTRAVENOUS

## 2018-11-23 MED ORDER — OXYCODONE HCL 5 MG/5ML PO SOLN: 5.0000 mg | Freq: Once | ORAL | Status: AC | PRN

## 2018-11-23 SURGICAL SUPPLY — 46 items
BLADE SURG SZ11 CARB STEEL (BLADE) ×4 IMPLANT
CANISTER SUCT 1200ML W/VALVE (MISCELLANEOUS) ×4 IMPLANT
CATH ROBINSON RED A/P 16FR (CATHETERS) ×4 IMPLANT
CHLORAPREP W/TINT 26 (MISCELLANEOUS) ×4 IMPLANT
COVER WAND RF STERILE (DRAPES) IMPLANT
DERMABOND ADVANCED (GAUZE/BANDAGES/DRESSINGS) ×2
DERMABOND ADVANCED .7 DNX12 (GAUZE/BANDAGES/DRESSINGS) ×2 IMPLANT
DRSG TELFA 4X3 1S NADH ST (GAUZE/BANDAGES/DRESSINGS) IMPLANT
GLOVE BIO SURGEON STRL SZ8 (GLOVE) ×8 IMPLANT
GLOVE INDICATOR 8.0 STRL GRN (GLOVE) ×4 IMPLANT
GOWN STRL REUS W/ TWL LRG LVL3 (GOWN DISPOSABLE) ×2 IMPLANT
GOWN STRL REUS W/ TWL XL LVL3 (GOWN DISPOSABLE) ×2 IMPLANT
GOWN STRL REUS W/TWL LRG LVL3 (GOWN DISPOSABLE) ×2
GOWN STRL REUS W/TWL XL LVL3 (GOWN DISPOSABLE) ×2
GRASPER SUT TROCAR 14GX15 (MISCELLANEOUS) ×4 IMPLANT
IRRIGATION STRYKERFLOW (MISCELLANEOUS) IMPLANT
IRRIGATOR STRYKERFLOW (MISCELLANEOUS)
IV LACTATED RINGERS 1000ML (IV SOLUTION) IMPLANT
KIT PINK PAD W/HEAD ARE REST (MISCELLANEOUS) ×4
KIT PINK PAD W/HEAD ARM REST (MISCELLANEOUS) ×2 IMPLANT
KIT PROCEDURE FLUENT (KITS) ×2 IMPLANT
KIT TURNOVER CYSTO (KITS) ×4 IMPLANT
LABEL OR SOLS (LABEL) ×4 IMPLANT
NEEDLE VERESS 14GA 120MM (NEEDLE) ×4 IMPLANT
NS IRRIG 500ML POUR BTL (IV SOLUTION) ×4 IMPLANT
PACK DNC HYST (MISCELLANEOUS) ×2 IMPLANT
PACK GYN LAPAROSCOPIC (MISCELLANEOUS) ×4 IMPLANT
PAD OB MATERNITY 4.3X12.25 (PERSONAL CARE ITEMS) ×4 IMPLANT
PAD PREP 24X41 OB/GYN DISP (PERSONAL CARE ITEMS) ×4 IMPLANT
POUCH SPECIMEN RETRIEVAL 10MM (ENDOMECHANICALS) IMPLANT
SCISSORS METZENBAUM CVD 33 (INSTRUMENTS) ×4 IMPLANT
SEAL ROD LENS SCOPE MYOSURE (ABLATOR) ×2 IMPLANT
SET TUBE SMOKE EVAC HIGH FLOW (TUBING) ×4 IMPLANT
SHEARS HARMONIC ACE PLUS 36CM (ENDOMECHANICALS) ×2 IMPLANT
SLEEVE ENDOPATH XCEL 5M (ENDOMECHANICALS) ×4 IMPLANT
SPONGE GAUZE 2X2 8PLY STER LF (GAUZE/BANDAGES/DRESSINGS)
SPONGE GAUZE 2X2 8PLY STRL LF (GAUZE/BANDAGES/DRESSINGS) IMPLANT
STRAP SAFETY 5IN WIDE (MISCELLANEOUS) ×4 IMPLANT
SUT VIC AB 0 CT1 36 (SUTURE) ×4 IMPLANT
SUT VIC AB 2-0 UR6 27 (SUTURE) IMPLANT
SUT VIC AB 4-0 PS2 18 (SUTURE) IMPLANT
SYR 10ML LL (SYRINGE) ×4 IMPLANT
SYSTEM WECK SHIELD CLOSURE (TROCAR) IMPLANT
TOWEL OR 17X26 4PK STRL BLUE (TOWEL DISPOSABLE) ×4 IMPLANT
TROCAR ENDO BLADELESS 11MM (ENDOMECHANICALS) IMPLANT
TROCAR XCEL NON-BLD 5MMX100MML (ENDOMECHANICALS) ×4 IMPLANT

## 2018-11-23 NOTE — Anesthesia Procedure Notes (Signed)
Procedure Name: Intubation Performed by: Fletcher-Harrison, Sherilee Smotherman, CRNA Pre-anesthesia Checklist: Patient identified, Emergency Drugs available, Suction available and Patient being monitored Patient Re-evaluated:Patient Re-evaluated prior to induction Oxygen Delivery Method: Circle system utilized Preoxygenation: Pre-oxygenation with 100% oxygen Induction Type: IV induction Ventilation: Mask ventilation without difficulty Laryngoscope Size: McGraph and 3 Grade View: Grade I Tube type: Oral Tube size: 7.0 mm Number of attempts: 1 Airway Equipment and Method: Stylet Placement Confirmation: ETT inserted through vocal cords under direct vision,  positive ETCO2,  CO2 detector and breath sounds checked- equal and bilateral Secured at: 22 cm Tube secured with: Tape Dental Injury: Teeth and Oropharynx as per pre-operative assessment        

## 2018-11-23 NOTE — Op Note (Signed)
  Operative Note   11/23/2018  PRE-OP DIAGNOSIS: Menorrhagia, Ovarian Cyst, Pelvic Pain   POST-OP DIAGNOSIS: same   PROCEDURE: Procedure(s): LAPAROSCOPIC LEFT OVARIAN CYSTECTOMY DILATATION AND CURETTAGE   SURGEON: Barnett Applebaum, MD, FACOG  ASST: Dr Gilman Schmidt, No other capable assistant available, in surgery requiring high level assistant.  ANESTHESIA: Choice   ESTIMATED BLOOD LOSS: Min  COMPLICATIONS: none  DISPOSITION: PACU - hemodynamically stable.  CONDITION: stable  FINDINGS: Laparoscopic survey of the abdomen revealed a grossly normal uterus, tubes, right ovary, liver edge, gallbladder edge and appendix, No intra-abdominal adhesions were noted. Left simple 5 cm Ovarian Cyst noted.  PROCEDURE IN DETAIL: The patient was taken to the OR where anesthesia was administed. The patient was positioned in dorsal lithotomy in the [place]. The patient was then examined under anesthesia with the above noted findings. The patient was prepped and draped in the normal sterile fashion and foley catheter was placed. A Graves speculum was placed in the vagina and the anterior lip of the cervix was grasped with a single toothed tenaculum. The cervix is dilate to a 20 Pratt dilator and curettage performed with a banjo curette until gritty texture encountered; no complications. A H[place]ka uterine manipulator was then inserted in the uterus. Uterine mobility was found to be satisfactory. The speculum was then removed.  Attention was turned to the patient's abdomen where a 5 mm skin incision was made in the umbilical fold, after injection of local anesthesia. The Veress step needle was carefully introduced into the peritoneal cavity with placement confirmed using the hanging drop technique.  Pneumoperitoneum was obtained. The 5 mm port was then placed under direct visualization with the operative laparoscope.  Trendelenburg positioning.  Additional 74mm trocars was then placed in the RLQ and LLQ lateral  to the inferior epigastric blood vessels under direct visualization with the laparoscope.  Instrumentation to visualize complete pelvic anatomy performed.  The ovarian cyst is identified and stabilized.  An incision is made with yellow fluid noted.  Fluid is aspirated (70 mL).  Cyst wall is dissected free from the ovarian cortex and removed.  Hemostasis is visualized and assured.  Contralateral ovary seen as normal.  Pelvic cavity is cleaned with any fluid aspirated.  Instruments and trocars removed, gas expelled, and skin closed with skin adhesive glue.  Instrument, needle, and sponge counts correct x2 at the conclusion of the case.  Pt goes to recovery room in stable condition.  Barnett Applebaum, MD, [facility], [facility] 11/23/2018  8:33 AM

## 2018-11-23 NOTE — Anesthesia Preprocedure Evaluation (Addendum)
Anesthesia Evaluation  Patient identified by MRN, date of birth, ID band Patient awake    Reviewed: Allergy & Precautions, H&P , NPO status , Patient's Chart, lab work & pertinent test results  Airway Mallampati: III  TM Distance: >3 FB Neck ROM: full    Dental  (+) Teeth Intact   Pulmonary neg COPD, neg recent URI, Current Smoker and Patient abstained from smoking.,           Cardiovascular (-) angina(-) Past MI negative cardio ROS  (-) dysrhythmias      Neuro/Psych negative neurological ROS  negative psych ROS   GI/Hepatic negative GI ROS, Neg liver ROS,   Endo/Other  negative endocrine ROS  Renal/GU      Musculoskeletal   Abdominal   Peds  Hematology negative hematology ROS (+)   Anesthesia Other Findings Obesity  Past Medical History: 07/2018: MRSA (methicillin resistant staph aureus) culture positive No date: Ovarian cyst  Past Surgical History: No date: CESAREAN SECTION No date: CHOLECYSTECTOMY No date: FOOT SURGERY; Left No date: HAND SURGERY; Right No date: KNEE SURGERY; Right No date: KNEE SURGERY; Right No date: KNEE SURGERY; Right No date: TUBAL LIGATION No date: TUBAL LIGATION  BMI    Body Mass Index: 31.01 kg/m      Reproductive/Obstetrics negative OB ROS                            Anesthesia Physical Anesthesia Plan  ASA: II  Anesthesia Plan: General ETT   Post-op Pain Management:    Induction:   PONV Risk Score and Plan: Ondansetron, Dexamethasone, Midazolam and Treatment may vary due to age or medical condition  Airway Management Planned:   Additional Equipment:   Intra-op Plan:   Post-operative Plan:   Informed Consent: I have reviewed the patients History and Physical, chart, labs and discussed the procedure including the risks, benefits and alternatives for the proposed anesthesia with the patient or authorized representative who has  indicated his/her understanding and acceptance.     Dental Advisory Given  Plan Discussed with: Anesthesiologist, CRNA and Surgeon  Anesthesia Plan Comments:         Anesthesia Quick Evaluation

## 2018-11-23 NOTE — Transfer of Care (Signed)
Immediate Anesthesia Transfer of Care Note  Patient: Holly Berry  Procedure(s) Performed: LAPAROSCOPIC OVARIAN CYSTECTOMY (Left ) DILATATION AND CURETTAGE (N/A )  Patient Location: PACU  Anesthesia Type:General  Level of Consciousness: drowsy and responds to stimulation  Airway & Oxygen Therapy: Patient Spontanous Breathing and Patient connected to face mask oxygen  Post-op Assessment: Report given to RN and Post -op Vital signs reviewed and stable  Post vital signs: Reviewed and stable  Last Vitals:  Vitals Value Taken Time  BP 114/66 11/23/18 0837  Temp 36.6 C 11/23/18 0837  Pulse 73 11/23/18 0848  Resp 23 11/23/18 0848  SpO2 100 % 11/23/18 0848  Vitals shown include unvalidated device data.  Last Pain:  Vitals:   11/23/18 0837  TempSrc:   PainSc: Asleep         Complications: No apparent anesthesia complications

## 2018-11-23 NOTE — Interval H&P Note (Signed)
History and Physical Interval Note:  11/23/2018 7:15 AM  Holly Berry  has presented today for surgery, with the diagnosis of Left Ovarian Cyst N83.202 Pelvic Pain R10.2, Menometrorrhagia N92.1.  The various methods of treatment have been discussed with the patient and family. After consideration of risks, benefits and other options for treatment, the patient has consented to  Procedure(s): LAPAROSCOPIC OVARIAN CYSTECTOMY (Left) DILATATION AND CURETTAGE (N/A) as a surgical intervention.  The patient's history has been reviewed, patient examined, no change in status, stable for surgery.  I have reviewed the patient's chart and labs.  Questions were answered to the patient's satisfaction.     Hoyt Koch

## 2018-11-23 NOTE — Anesthesia Postprocedure Evaluation (Signed)
Anesthesia Post Note  Patient: Holly Berry  Procedure(s) Performed: LAPAROSCOPIC OVARIAN CYSTECTOMY (Left ) DILATATION AND CURETTAGE (N/A )  Patient location during evaluation: PACU Anesthesia Type: General Level of consciousness: awake and alert Pain management: pain level controlled Vital Signs Assessment: post-procedure vital signs reviewed and stable Respiratory status: spontaneous breathing, nonlabored ventilation and respiratory function stable Cardiovascular status: blood pressure returned to baseline and stable Postop Assessment: no apparent nausea or vomiting Anesthetic complications: no     Last Vitals:  Vitals:   11/23/18 0930 11/23/18 0956  BP: 118/63 113/72  Pulse: 60 73  Resp: 16 16  Temp: (!) 36.1 C (!) 36.2 C  SpO2: 99% 100%    Last Pain:  Vitals:   11/23/18 0956  TempSrc: Temporal  PainSc: [place]

## 2018-11-23 NOTE — Anesthesia Post-op Follow-up Note (Signed)
Anesthesia QCDR form completed.        

## 2018-11-23 NOTE — Discharge Instructions (Addendum)
Ovarian Cystectomy, Care After °This sheet gives you information about how to care for yourself after your procedure. Your health care provider may also give you more specific instructions. If you have problems or questions, contact your health care provider. °What can I expect after the procedure? °After the procedure, it is common to have: °· Pain in your abdomen, especially at the incision areas. You will be given pain medicines to control the pain. °· Tiredness. This is a normal part of the recovery process. Your energy level will return to normal over the next several weeks. °· Problems passing stool (constipation). °Follow these instructions at home: °Medicines °· Take over-the-counter and prescription medicines only as told by your health care provider. °· If you were prescribed an antibiotic medicine, use it as told by your health care provider. Do not stop using the antibiotic even if you start to feel better. °· Do not take aspirin because it can cause bleeding. °· Do not drink alcohol while taking prescription pain medicine. °· Do not drive or use heavy machinery while taking prescription pain medicine. °Incision care ° °· Follow instructions from your health care provider about how to take care of your incisions. Make sure you: °? Wash your hands with soap and water before you change your bandage (dressing). If soap and water are not available, use hand sanitizer. °? Change your dressing as told by your health care provider. °? Leave stitches (sutures), skin glue, or adhesive strips in place. These skin closures may need to stay in place for 2 weeks or longer. If adhesive strip edges start to loosen and curl up, you may trim the loose edges. Do not remove adhesive strips completely unless your health care provider tells you to do that. °· Check your incision areas every day for signs of infection. Check for: °? Redness, swelling, or pain. °? Fluid or blood. °? Warmth. °? Pus or a bad smell. °· Do not  take baths, swim, or use a hot tub until your health care provider approves. Take showers instead of baths. °Activity °· Return to your normal activities and diet as told by your health care provider. Ask your health care provider what activities are safe for you. °· Take rest breaks during the day as needed. °· Do not drive until your health care provider approves. °General instructions °· Do not douche, use tampons, or have sexual intercourse until your health care provider says it is okay to do so. °· To prevent or treat constipation while you are taking prescription pain medicine, your health care provider may recommend that you: °? Take over-the-counter or prescription medicines. °? Eat foods that are high in fiber, such as fresh fruits and vegetables, whole grains, and beans. °? Drink enough fluid to keep your urine clear or pale yellow. °? Limit foods that are high in fat and processed sugars, such as fried and sweet foods. °· Keep all follow-up visits as told by your health care provider. This is important. °Contact a health care provider if: °· You have a fever. °· You feel nauseous or you vomit. °· You have pain when you urinate or have blood in your urine. °· You have a rash on your body. °· You have pain or redness where the IV was inserted. °· You have pain that is not relieved with medicine. °· You have signs of infection, such as: °? Redness, swelling, or pain around your incisions. °? Fluid or blood coming from your incisions. °? An incision that   feels warm to the touch. ? Pus or a bad smell coming from your incisions. Get help right away if:  You have chest pain or shortness of breath.  You feel dizzy or light-headed.  You have increasing abdominal pain that is not relieved with medicines.  You have pain, swelling, or redness in your leg.  Your incision is opening (the edges are not staying together). Summary  After the procedure, it is common to have some pain in your abdomen. You  will be given pain medicines to control the pain.  Follow instructions from your health care provider about how to take care of your incisions.  Do not douche, use tampons, or have sexual intercourse until your health care provider says it is okay to do so.  Keep all follow-up visits as told by your health care provider. This is important. This information is not intended to replace advice given to you by your health care provider. Make sure you discuss any questions you have with your health care provider. Document Released: 11/03/2012 Document Revised: 12/26/2016 Document Reviewed: 03/04/2016 Elsevier Patient Education  [place]   1) The drugs that you were given will stay in your system until tomorrow so for the next 24 hours you should not:  A) Drive an automobile B) Make any legal decisions C) Drink any alcoholic beverage   2) You may resume regular meals tomorrow.  Today it is better to start with liquids and gradually work up to solid foods.  You may eat anything you prefer, but it is better to start with liquids, then soup and crackers, and gradually work up to solid foods.   3) Please notify your doctor immediately if you have any unusual bleeding, trouble breathing, redness and pain at the surgery site, drainage, fever, or pain not relieved by medication.    4) Additional Instructions:        Please contact your physician with any problems or Same Day Surgery at [phone], Monday through Friday 6 am to 4 pm, or [place] at [facility] number at [phone].

## 2018-11-24 LAB — SURGICAL PATHOLOGY

## 2018-11-30 ENCOUNTER — Encounter: Payer: Self-pay | Admitting: Obstetrics & Gynecology

## 2018-11-30 NOTE — Telephone Encounter (Signed)
Please advise 

## 2018-12-06 NOTE — Telephone Encounter (Signed)
Patient is schedule 2:20 tomorrow 12/07/18 with [facility]

## 2018-12-06 NOTE — Telephone Encounter (Signed)
Per [place] someone can see her today, or he can see her tomorrow, please call and schedule

## 2018-12-07 ENCOUNTER — Encounter: Payer: Self-pay | Admitting: Obstetrics & Gynecology

## 2018-12-07 ENCOUNTER — Ambulatory Visit (INDEPENDENT_AMBULATORY_CARE_PROVIDER_SITE_OTHER): Payer: Medicaid Other | Admitting: Obstetrics & Gynecology

## 2018-12-07 ENCOUNTER — Other Ambulatory Visit: Payer: Self-pay

## 2018-12-07 VITALS — BP 110/60 | Ht 67.5 in | Wt 197.0 lb

## 2018-12-07 DIAGNOSIS — N83202 Unspecified ovarian cyst, left side: Secondary | ICD-10-CM

## 2018-12-07 MED ORDER — OXYCODONE-ACETAMINOPHEN 5-325 MG PO TABS: 1.0000 | ORAL_TABLET | ORAL | 0 refills | Status: DC | PRN

## 2018-12-07 NOTE — Progress Notes (Signed)
  Postoperative Follow-up Patient presents post op from [facility], Laparoscopy, and  left ovarian cystectomy for pelvic pain and cyst, 2 weeks ago.  Pathology: DIAGNOSIS:  A. ENDOMETRIUM; CURETTAGE:  - PROLIFERATIVE PHASE ENDOMETRIUM.  - SCANT FRAGMENTS OF BENIGN ENDO AND ECTOCERVICAL TISSUE.  - NEGATIVE FOR ATYPICAL HYPERPLASIA/EIN, DYSPLASIA, AND MALIGNANCY.   B. OVARY, LEFT; CYSTECTOMY:  - FRAGMENTS OF BENIGN CYST WALL, COMPATIBLE WITH CYSTIC FOLLICLE.  - BACKGROUND BENIGN OVARIAN PARENCHYMA WITH PHYSIOLOGIC CHANGES.  - NEGATIVE FOR MALIGNANCY.   Images:   Subjective: Patient reports some improvement in her preop symptoms. Eating a regular diet without difficulty. Pain is controlled with current analgesics. Medications being used: narcotic analgesics including Percocet.  Activity: sedentary. Patient reports additional symptom's since surgery of None.  Objective: BP 110/60   Ht 5' 7.5" (1.715 m)   Wt 197 lb (89.4 kg)   LMP 11/14/2018 (Exact Date)   BMI 30.40 kg/m  Physical Exam Constitutional:      General: She is not in acute distress.    Appearance: She is well-developed.  Cardiovascular:     Rate and Rhythm: Normal rate.  Pulmonary:     Effort: Pulmonary effort is normal.  Abdominal:     General: There is no distension.     Palpations: Abdomen is soft.     Tenderness: There is no abdominal tenderness.     Comments: Incision Healing Well No mass, erythema, other Pain to deeper palpation and is diffuse No rebound or guarding  Musculoskeletal: Normal range of motion.  Neurological:     Mental Status: She is alert and oriented to person, place, and time.     Cranial Nerves: No cranial nerve deficit.  Skin:    General: Skin is warm and dry.    Assessment: s/p :  left ovarian cystectomy, laparoscopy and D&C stable  Plan: Patient has done well after surgery with no apparent complications.  I have discussed the post-operative course to date, and the expected progress  moving forward.  The patient understands what complications to be concerned about.  I will see the patient in routine follow up, or sooner if needed.    Activity plan: No restriction. Percocet refill #15 Monitor for recovery Note for work Consider CT or other modality of investigation if pain persists  Hoyt Koch 12/07/2018, 2:54 PM

## 2018-12-10 ENCOUNTER — Ambulatory Visit: Payer: Medicaid Other | Admitting: Obstetrics & Gynecology

## 2018-12-20 ENCOUNTER — Encounter: Payer: Self-pay | Admitting: Obstetrics & Gynecology

## 2019-02-03 DIAGNOSIS — Z0289 Encounter for other administrative examinations: Secondary | ICD-10-CM

## 2019-02-28 ENCOUNTER — Other Ambulatory Visit: Payer: Self-pay

## 2019-02-28 ENCOUNTER — Ambulatory Visit (INDEPENDENT_AMBULATORY_CARE_PROVIDER_SITE_OTHER): Payer: Medicaid Other | Admitting: Obstetrics & Gynecology

## 2019-02-28 ENCOUNTER — Encounter: Payer: Self-pay | Admitting: Obstetrics & Gynecology

## 2019-02-28 VITALS — BP 122/80 | Ht 67.0 in | Wt 199.0 lb

## 2019-02-28 DIAGNOSIS — N946 Dysmenorrhea, unspecified: Secondary | ICD-10-CM | POA: Diagnosis not present

## 2019-02-28 DIAGNOSIS — N921 Excessive and frequent menstruation with irregular cycle: Secondary | ICD-10-CM | POA: Diagnosis not present

## 2019-02-28 NOTE — Patient Instructions (Signed)
Total Laparoscopic Hysterectomy A total laparoscopic hysterectomy is a minimally invasive surgery to remove the uterus and cervix. The fallopian tubes and ovaries can also be removed (bilateral salpingo-oophorectomy) during this surgery, if necessary. This procedure may be done to treat problems such as:  Noncancerous growths in the uterus (uterine fibroids) that cause symptoms.  A condition that causes the lining of the uterus (endometrium) to grow in other areas (endometriosis).  Problems with pelvic support. This is caused by weakened muscles of the pelvis following vaginal childbirth or menopause.  Cancer of the cervix, ovaries, uterus, or endometrium.  Excessive (dysfunctional) uterine bleeding. This surgery is performed by inserting a thin, lighted tube (laparoscope) and surgical instruments into small incisions in the abdomen. The laparoscope sends images to a monitor. The images help the health care provider perform the procedure. After this procedure, you will no longer be able to have a baby, and you will no longer have a menstrual period. Tell a health care provider about:  Any allergies you have.  All medicines you are taking, including vitamins, herbs, eye drops, creams, and over-the-counter medicines.  Any problems you or family members have had with anesthetic medicines.  Any blood disorders you have.  Any surgeries you have had.  Any medical conditions you have.  Whether you are pregnant or may be pregnant. What are the risks? Generally, this is a safe procedure. However, problems may occur, including:  Infection.  Bleeding.  Blood clots in the legs or lungs.  Allergic reactions to medicines.  Damage to other structures or organs.  The risk that the surgery may have to be switched to the regular one in which a large incision is made in the abdomen (abdominal hysterectomy). What happens before the procedure? Staying hydrated Follow instructions from your  health care provider about hydration, which may include:  Up to 2 hours before the procedure - you may continue to drink clear liquids, such as water, clear fruit juice, black coffee, and plain tea Eating and drinking restrictions Follow instructions from your health care provider about eating and drinking, which may include:  8 hours before the procedure - stop eating heavy meals or foods such as meat, fried foods, or fatty foods.  6 hours before the procedure - stop eating light meals or foods, such as toast or cereal.  6 hours before the procedure - stop drinking milk or drinks that contain milk.  2 hours before the procedure - stop drinking clear liquids. Medicines  Ask your health care provider about: ? Changing or stopping your regular medicines. This is especially important if you are taking diabetes medicines or blood thinners. ? Taking over-the-counter medicines, vitamins, herbs, and supplements. ? Taking medicines such as aspirin and ibuprofen. These medicines can thin your blood. Do not take these medicines unless your health care provider tells you to take them.  You may be given antibiotic medicine to help prevent infection.  You may be asked to take laxatives.  You may be given medicines to help prevent nausea and vomiting after the procedure. General instructions  Ask your health care provider how your surgical site will be marked or identified.  You may be asked to shower with a germ-killing soap.  Do not use any products that contain nicotine or tobacco, such as cigarettes and e-cigarettes. If you need help quitting, ask your health care provider.  You may have an exam or testing, such as an ultrasound to determine the size and shape of your pelvic organs.    You may have a blood or urine sample taken.  This procedure can affect the way you feel about yourself. Talk with your health care provider about the physical and emotional changes hysterectomy may  cause.  Plan to have someone take you home from the hospital or clinic.  Plan to have a responsible adult care for you for at least 24 hours after you leave the hospital or clinic. This is important. What happens during the procedure?  To lower your risk of infection: ? Your health care team will wash or sanitize their hands. ? Your skin will be washed with soap. ? Hair may be removed from the surgical area.  An IV will be inserted into one of your veins.  You will be given one or more of the following: ? A medicine to help you relax (sedative). ? A medicine to make you fall asleep (general anesthetic).  You will be given antibiotic medicine through your IV.  A tube may be inserted down your throat to help you breathe during the procedure.  A gas (carbon dioxide) will be used to inflate your abdomen to allow your surgeon to see inside of your abdomen.  Three or four small incisions will be made in your abdomen.  A laparoscope will be inserted into one of your incisions. Surgical instruments will be inserted through the other incisions in order to perform the procedure.  Your uterus and cervix may be removed through your vagina or cut into small pieces and removed through the small incisions. Any other organs that need to be removed will also be removed this way.  Carbon dioxide will be released from inside of your abdomen.  Your incisions will be closed with stitches (sutures).  A bandage (dressing) may be placed over your incisions. The procedure may vary among health care providers and hospitals. What happens after the procedure?  Your blood pressure, heart rate, breathing rate, and blood oxygen level will be monitored until the medicines you were given have worn off.  You will be given medicine for pain and nausea as needed.  Do not drive for 24 hours if you received a sedative. Summary  Total Laparoscopic hysterectomy is a procedure to remove your uterus, cervix and  sometimes the fallopian tubes and ovaries.  This procedure can affect the way you feel about yourself. Talk with your health care provider about the physical and emotional changes hysterectomy may cause.  After this procedure, you will no longer be able to have a baby, and you will no longer have a menstrual period.  You will be given pain medicine to control discomfort after this procedure. This information is not intended to replace advice given to you by your health care provider. Make sure you discuss any questions you have with your health care provider. Document Revised: 12/26/2016 Document Reviewed: 03/26/2016 Elsevier Patient Education  2020 [employer]  

## 2019-02-28 NOTE — Progress Notes (Signed)
HPI:      [name] is a 34 y.o. [phone] who LMP was No LMP recorded. (Menstrual status: Irregular Periods)., presents today for a problem visit.  She complains of menometrorrhagia that  began several months ago and its severity is described as severe.  She has regular periods every 28 days and also BTB random times during the month and they are associated with severe menstrual cramping.  She has to miss work at times due to pain or the amount of bleeding.  She had lap ovarian cystectomy and D&C several mos ago with no change in symptoms of pain or bleeding.  She has used the following for attempts at control: tampon and pad.  Previous evaluation: as above. Prior Diagnosis: menometrorrhagia and ovarian cysts. Previous Treatment: She has side effects to OCPs and has been unable to have IUD placed (2 different attempts).  She does not desire ablation.  She has had BTL and has 4 children and does not desire fertility.  PMHx: She  has a past medical history of MRSA (methicillin resistant staph aureus) culture positive (07/2018) and Ovarian cyst. Also,  has a past surgical history that includes Tubal ligation; Cholecystectomy; Knee surgery (Right); Hand surgery (Right); Foot surgery (Left); Cesarean section; Tubal ligation; Knee surgery (Right); Knee surgery (Right); Laparoscopic ovarian cystectomy (Left, 11/23/2018); and Hysteroscopy with D & C (N/A, 11/23/2018)., family history is not on file.,  reports that she has been smoking cigarettes. She has been smoking about 0.50 packs per day. She has never used smokeless tobacco. She reports that she does not drink alcohol or use drugs.  She  Current Outpatient Medications:  .  Ibuprofen-Acetaminophen (ADVIL DUAL ACTION) 125-250 MG TABS, Take 2 tablets by mouth 3 (three) times daily as needed (pain)., Disp: , Rfl:   Also, is allergic to tramadol and penicillins.  Review of Systems  Constitutional: Negative for chills, fever and malaise/fatigue.   HENT: Negative for congestion, sinus pain and sore throat.   Eyes: Negative for blurred vision and pain.  Respiratory: Negative for cough and wheezing.   Cardiovascular: Negative for chest pain and leg swelling.  Gastrointestinal: Negative for abdominal pain, constipation, diarrhea, heartburn, nausea and vomiting.  Genitourinary: Negative for dysuria, frequency, hematuria and urgency.  Musculoskeletal: Negative for back pain, joint pain, myalgias and neck pain.  Skin: Negative for itching and rash.  Neurological: Negative for dizziness, tremors and weakness.  Endo/Heme/Allergies: Does not bruise/bleed easily.  Psychiatric/Behavioral: Negative for depression. The patient is not nervous/anxious and does not have insomnia.     Objective: BP 122/80   Ht 5\' 7"  (1.702 m)   Wt 199 lb (90.3 kg)   BMI 31.17 kg/m  Physical Exam Constitutional:      General: She is not in acute distress.    Appearance: She is well-developed.  Musculoskeletal:        General: Normal range of motion.  Neurological:     Mental Status: She is alert and oriented to person, place, and time.  Skin:    General: Skin is warm and dry.  Vitals reviewed.     ASSESSMENT/PLAN:  Problem List Items Addressed This Visit      Other   Menometrorrhagia - Primary    Treatment option for menorrhagia or menometrorrhagia discussed in great detail with the patient.  Options include hormonal therapy, IUD therapy such as Mirena, D&C, Ablation, and Hysterectomy.  The pros and cons of each option discussed with patient.  I have had a careful  discussion with this patient about all the options available and the risk/benefits of each. I have fully informed this patient that a hysterectomy may subject her to a variety of discomforts and risks: She understands that most patients have surgery with little difficulty, but problems can happen ranging from minor to fatal. These include nausea, vomiting, pain, bleeding, infection, poor  healing, hernia, wound separation, vaginal cuff separation or formation of adhesions. Unexpected reactions may occur from any drug or anesthetic given. Unintended injury may occur to other pelvic or abdominal structures such as Fallopian tubes, ovaries, bladder, ureter (tube from kidney to bladder), or bowel. Nerves going from the pelvis to the legs may be injured. Any such injury may require immediate or later additional surgery to correct the problem. Excessive blood loss requiring transfusion is very unlikely but possible. Dangerous blood clots may form in the legs or lungs. Physical and sexual activity will be restricted in varying degrees for an indeterminate period of time but most often 2-4 weeks. She understands that the plan is to do this laparoscopically, however, there is a chance that this will need to be performed via a larger incision. She may be hospitalized overnight. Finally, she understands that it is impossible to list every possible undesirable effect and that the condition for which surgery is done is not always cured or significantly improved, and in rare cases may be even worse. I have also counseled her extensively about the pros and cons of ovarian conservation versus removal. Ample time was given to answer all questions.  Consent today obtained. To schedule TLH BS soon.  A total of 20 minutes were spent face-to-face with the patient as well as preparation, review, communication, and documentation during this encounter.    Annamarie Major, MD, [facility], [facility] 02/28/2019  9:16 AM

## 2019-03-07 ENCOUNTER — Telehealth: Payer: Self-pay | Admitting: Obstetrics & Gynecology

## 2019-03-07 NOTE — Telephone Encounter (Signed)
Patient is aware of H&P at [facility] on 3/2 @ 9am for updated H&P/Medicaid Hysterectomy consent, Pre-admit testing to be scheduled, COVID testing on 3/5, and OR on 04/05/19. Patient is aware to quarantine after COVID testing. Patient is aware she may receive calls from the [facility] and [facility]. Patient confirmed Medicaid.

## 2019-03-07 NOTE — Telephone Encounter (Signed)
-----   Message from Nadara Mustard, MD sent at 02/28/2019  9:07 AM EST ----- Regarding: Surgery Surgery Booking Request Patient Full Name:  Holly Berry  MRN: [id number]  DOB: [birth year]  Surgeon: Letitia Libra, MD  Requested Surgery Date and Time: Any Primary Diagnosis AND Code: Menometrorrhagia Secondary Diagnosis and Code: N92.1 Surgical Procedure: TLH/BS L&D Notification: No Admission Status: same day surgery Length of Surgery: 1 hr Special Case Needs: No H&P: No, done today Phone Interview???:  Yes Interpreter: No Language:  Medical Clearance:  No Special Scheduling Instructions: no Any known health/anesthesia issues, diabetes, sleep apnea, latex allergy, defibrillator/pacemaker?: No Acuity: P3   (P1 highest, P2 delay may cause harm, P3 low, elective gyn, P4 lowest) Priority 2

## 2019-03-29 ENCOUNTER — Encounter: Payer: Self-pay | Admitting: Obstetrics & Gynecology

## 2019-03-29 ENCOUNTER — Ambulatory Visit (INDEPENDENT_AMBULATORY_CARE_PROVIDER_SITE_OTHER): Payer: Medicaid Other | Admitting: Obstetrics & Gynecology

## 2019-03-29 ENCOUNTER — Other Ambulatory Visit: Payer: Self-pay

## 2019-03-29 VITALS — BP 120/80 | Ht 67.0 in | Wt 198.0 lb

## 2019-03-29 DIAGNOSIS — R102 Pelvic and perineal pain: Secondary | ICD-10-CM | POA: Diagnosis not present

## 2019-03-29 DIAGNOSIS — Z01818 Encounter for other preprocedural examination: Secondary | ICD-10-CM

## 2019-03-29 DIAGNOSIS — N921 Excessive and frequent menstruation with irregular cycle: Secondary | ICD-10-CM | POA: Diagnosis not present

## 2019-03-29 LAB — POCT URINALYSIS DIPSTICK
Bilirubin, UA: NEGATIVE
Blood, UA: POSITIVE
Glucose, UA: NEGATIVE
Ketones, UA: NEGATIVE
Leukocytes, UA: NEGATIVE
Nitrite, UA: POSITIVE
Protein, UA: NEGATIVE
Spec Grav, UA: 1.01 (ref 1.010–1.025)
Urobilinogen, UA: 0.2 E.U./dL
pH, UA: 5 (ref 5.0–8.0)

## 2019-03-29 MED ORDER — SULFAMETHOXAZOLE-TRIMETHOPRIM 800-160 MG PO TABS: 1.0000 | ORAL_TABLET | Freq: Two times a day (BID) | ORAL | 0 refills | Status: AC

## 2019-03-29 NOTE — Patient Instructions (Signed)

## 2019-03-29 NOTE — H&P (View-Only) (Signed)
PRE-OPERATIVE HISTORY AND PHYSICAL EXAM  HPI:  Holly Berry is a 34 y.o. [id number] No LMP recorded. (Menstrual status: Irregular Periods).; she is being admitted for surgery related to abnormal uterine bleeding and pelvic pain.  She complains of menometrorrhagia that  began several months ago and its severity is described as severe.  She has regular periods every 28 days and also BTB random times during the month and they are associated with severe menstrual cramping.  She has to miss work at times due to pain or the amount of bleeding.  She had lap ovarian cystectomy and D&C several mos ago with no change in symptoms of pain or bleeding.  She has used the following for attempts at control: tampon and pad.  Previous evaluation: as above. Prior Diagnosis: menometrorrhagia and ovarian cysts. Previous Treatment: She has side effects to OCPs and has been unable to have IUD placed (2 different attempts).  She does not desire ablation.  She has had BTL and has 4 children and does not desire fertility.  PMHx: Past Medical History:  Diagnosis Date  . MRSA (methicillin resistant staph aureus) culture positive 07/2018  . Ovarian cyst    Past Surgical History:  Procedure Laterality Date  . CESAREAN SECTION    . CHOLECYSTECTOMY    . FOOT SURGERY Left   . HAND SURGERY Right   . HYSTEROSCOPY WITH D & C N/A 11/23/2018   Procedure: DILATATION AND CURETTAGE;  Surgeon: Nadara Mustard, MD;  Location: [facility] ORS;  Service: Gynecology;  Laterality: N/A;  . KNEE SURGERY Right   . KNEE SURGERY Right   . KNEE SURGERY Right   . LAPAROSCOPIC OVARIAN CYSTECTOMY Left 11/23/2018   Procedure: LAPAROSCOPIC OVARIAN CYSTECTOMY;  Surgeon: Nadara Mustard, MD;  Location: [facility] ORS;  Service: Gynecology;  Laterality: Left;  . TUBAL LIGATION    . TUBAL LIGATION     History reviewed. No pertinent family history. Social History   Tobacco Use  . Smoking status: Current Every Day Smoker    Packs/day: 0.50   Types: Cigarettes  . Smokeless tobacco: Never Used  Substance Use Topics  . Alcohol use: No  . Drug use: No    Current Outpatient Medications:  .  ibuprofen (ADVIL) 800 MG tablet, Take 400-800 mg by mouth every 8 (eight) hours as needed (for pain.)., Disp: , Rfl:  .  Ibuprofen-Acetaminophen (ADVIL DUAL ACTION) 125-250 MG TABS, Take 2 tablets by mouth 3 (three) times daily as needed (pain)., Disp: , Rfl:  .  sulfamethoxazole-trimethoprim (BACTRIM DS) 800-160 MG tablet, Take 1 tablet by mouth 2 (two) times daily for 3 days., Disp: 6 tablet, Rfl: 0 Allergies: Tramadol and Penicillins  Review of Systems  Constitutional: Negative for chills, fever and malaise/fatigue.  HENT: Negative for congestion, sinus pain and sore throat.   Eyes: Negative for blurred vision and pain.  Respiratory: Negative for cough and wheezing.   Cardiovascular: Negative for chest pain and leg swelling.  Gastrointestinal: Negative for abdominal pain, constipation, diarrhea, heartburn, nausea and vomiting.  Genitourinary: Negative for dysuria, frequency, hematuria and urgency.  Musculoskeletal: Negative for back pain, joint pain, myalgias and neck pain.  Skin: Negative for itching and rash.  Neurological: Negative for dizziness, tremors and weakness.  Endo/Heme/Allergies: Does not bruise/bleed easily.  Psychiatric/Behavioral: Negative for depression. The patient is not nervous/anxious and does not have insomnia.     Objective: BP 120/80   Ht 5\' 7"  (1.702 m)   Wt 198 lb (89.8 kg)  BMI 31.01 kg/m   Filed Weights   03/29/19 0906  Weight: 198 lb (89.8 kg)   Physical Exam Constitutional:      General: She is not in acute distress.    Appearance: She is well-developed.  HENT:     Head: Normocephalic and atraumatic. No laceration.     Right Ear: Hearing normal.     Left Ear: Hearing normal.     Mouth/Throat:     Pharynx: Uvula midline.  Eyes:     Pupils: Pupils are equal, round, and reactive to light.    Neck:     Thyroid: No thyromegaly.  Cardiovascular:     Rate and Rhythm: Normal rate and regular rhythm.     Heart sounds: No murmur. No friction rub. No gallop.   Pulmonary:     Effort: Pulmonary effort is normal. No respiratory distress.     Breath sounds: Normal breath sounds. No wheezing.  Chest:     Breasts:        Right: No mass, skin change or tenderness.        Left: No mass, skin change or tenderness.  Abdominal:     General: Bowel sounds are normal. There is no distension.     Palpations: Abdomen is soft.     Tenderness: There is no abdominal tenderness. There is no rebound.  Musculoskeletal:        General: Normal range of motion.     Cervical back: Normal range of motion and neck supple.  Neurological:     Mental Status: She is alert and oriented to person, place, and time.     Cranial Nerves: No cranial nerve deficit.  Skin:    General: Skin is warm and dry.  Psychiatric:        Judgment: Judgment normal.  Vitals reviewed.     Assessment: 1. Menometrorrhagia   2. Pelvic pain   Plan for TLH, BS, Cystoscopy Alternatives discussed as well  I have had a careful discussion with this patient about all the options available and the risk/benefits of each. I have fully informed this patient that surgery may subject her to a variety of discomforts and risks: She understands that most patients have surgery with little difficulty, but problems can happen ranging from minor to fatal. These include nausea, vomiting, pain, bleeding, infection, poor healing, hernia, or formation of adhesions. Unexpected reactions may occur from any drug or anesthetic given. Unintended injury may occur to other pelvic or abdominal structures such as Fallopian tubes, ovaries, bladder, ureter (tube from kidney to bladder), or bowel. Nerves going from the pelvis to the legs may be injured. Any such injury may require immediate or later additional surgery to correct the problem. Excessive blood loss  requiring transfusion is very unlikely but possible. Dangerous blood clots may form in the legs or lungs. Physical and sexual activity will be restricted in varying degrees for an indeterminate period of time but most often 2-6 weeks.  Finally, she understands that it is impossible to list every possible undesirable effect and that the condition for which surgery is done is not always cured or significantly improved, and in rare cases may be even worse.Ample time was given to answer all questions.  Barnett Applebaum, MD, [facility], [facility] 03/29/2019  9:21 AM

## 2019-03-29 NOTE — Progress Notes (Signed)
   PRE-OPERATIVE HISTORY AND PHYSICAL EXAM  HPI:  Holly Berry is a 34 y.o. G5P4014 No LMP recorded. (Menstrual status: Irregular Periods).; she is being admitted for surgery related to abnormal uterine bleeding and pelvic pain.  She complains of menometrorrhagia that  began several months ago and its severity is described as severe.  She has regular periods every 28 days and also BTB random times during the month and they are associated with severe menstrual cramping.  She has to miss work at times due to pain or the amount of bleeding.  She had lap ovarian cystectomy and D&C several mos ago with no change in symptoms of pain or bleeding.  She has used the following for attempts at control: tampon and pad.  Previous evaluation: as above. Prior Diagnosis: menometrorrhagia and ovarian cysts. Previous Treatment: She has side effects to OCPs and has been unable to have IUD placed (2 different attempts).  She does not desire ablation.  She has had BTL and has 4 children and does not desire fertility.  PMHx: Past Medical History:  Diagnosis Date  . MRSA (methicillin resistant staph aureus) culture positive 07/2018  . Ovarian cyst    Past Surgical History:  Procedure Laterality Date  . CESAREAN SECTION    . CHOLECYSTECTOMY    . FOOT SURGERY Left   . HAND SURGERY Right   . HYSTEROSCOPY WITH D & C N/A 11/23/2018   Procedure: DILATATION AND CURETTAGE;  Surgeon: Lorraine Cimmino P, MD;  Location: [facility] ORS;  Service: Gynecology;  Laterality: N/A;  . KNEE SURGERY Right   . KNEE SURGERY Right   . KNEE SURGERY Right   . LAPAROSCOPIC OVARIAN CYSTECTOMY Left 11/23/2018   Procedure: LAPAROSCOPIC OVARIAN CYSTECTOMY;  Surgeon: Tawni Melkonian P, MD;  Location: [facility] ORS;  Service: Gynecology;  Laterality: Left;  . TUBAL LIGATION    . TUBAL LIGATION     History reviewed. No pertinent family history. Social History   Tobacco Use  . Smoking status: Current Every Day Smoker    Packs/day: 0.50   Types: Cigarettes  . Smokeless tobacco: Never Used  Substance Use Topics  . Alcohol use: No  . Drug use: No    Current Outpatient Medications:  .  ibuprofen (ADVIL) 800 MG tablet, Take 400-800 mg by mouth every 8 (eight) hours as needed (for pain.)., Disp: , Rfl:  .  Ibuprofen-Acetaminophen (ADVIL DUAL ACTION) 125-250 MG TABS, Take 2 tablets by mouth 3 (three) times daily as needed (pain)., Disp: , Rfl:  .  sulfamethoxazole-trimethoprim (BACTRIM DS) 800-160 MG tablet, Take 1 tablet by mouth 2 (two) times daily for 3 days., Disp: 6 tablet, Rfl: 0 Allergies: Tramadol and Penicillins  Review of Systems  Constitutional: Negative for chills, fever and malaise/fatigue.  HENT: Negative for congestion, sinus pain and sore throat.   Eyes: Negative for blurred vision and pain.  Respiratory: Negative for cough and wheezing.   Cardiovascular: Negative for chest pain and leg swelling.  Gastrointestinal: Negative for abdominal pain, constipation, diarrhea, heartburn, nausea and vomiting.  Genitourinary: Negative for dysuria, frequency, hematuria and urgency.  Musculoskeletal: Negative for back pain, joint pain, myalgias and neck pain.  Skin: Negative for itching and rash.  Neurological: Negative for dizziness, tremors and weakness.  Endo/Heme/Allergies: Does not bruise/bleed easily.  Psychiatric/Behavioral: Negative for depression. The patient is not nervous/anxious and does not have insomnia.     Objective: BP 120/80   Ht 5' 7" (1.702 m)   Wt 198 lb (89.8 kg)     BMI 31.01 kg/m   Filed Weights   03/29/19 0906  Weight: 198 lb (89.8 kg)   Physical Exam Constitutional:      General: She is not in acute distress.    Appearance: She is well-developed.  HENT:     Head: Normocephalic and atraumatic. No laceration.     Right Ear: Hearing normal.     Left Ear: Hearing normal.     Mouth/Throat:     Pharynx: Uvula midline.  Eyes:     Pupils: Pupils are equal, round, and reactive to light.    Neck:     Thyroid: No thyromegaly.  Cardiovascular:     Rate and Rhythm: Normal rate and regular rhythm.     Heart sounds: No murmur. No friction rub. No gallop.   Pulmonary:     Effort: Pulmonary effort is normal. No respiratory distress.     Breath sounds: Normal breath sounds. No wheezing.  Chest:     Breasts:        Right: No mass, skin change or tenderness.        Left: No mass, skin change or tenderness.  Abdominal:     General: Bowel sounds are normal. There is no distension.     Palpations: Abdomen is soft.     Tenderness: There is no abdominal tenderness. There is no rebound.  Musculoskeletal:        General: Normal range of motion.     Cervical back: Normal range of motion and neck supple.  Neurological:     Mental Status: She is alert and oriented to person, place, and time.     Cranial Nerves: No cranial nerve deficit.  Skin:    General: Skin is warm and dry.  Psychiatric:        Judgment: Judgment normal.  Vitals reviewed.     Assessment: 1. Menometrorrhagia   2. Pelvic pain   Plan for TLH, BS, Cystoscopy Alternatives discussed as well  I have had a careful discussion with this patient about all the options available and the risk/benefits of each. I have fully informed this patient that surgery may subject her to a variety of discomforts and risks: She understands that most patients have surgery with little difficulty, but problems can happen ranging from minor to fatal. These include nausea, vomiting, pain, bleeding, infection, poor healing, hernia, or formation of adhesions. Unexpected reactions may occur from any drug or anesthetic given. Unintended injury may occur to other pelvic or abdominal structures such as Fallopian tubes, ovaries, bladder, ureter (tube from kidney to bladder), or bowel. Nerves going from the pelvis to the legs may be injured. Any such injury may require immediate or later additional surgery to correct the problem. Excessive blood loss  requiring transfusion is very unlikely but possible. Dangerous blood clots may form in the legs or lungs. Physical and sexual activity will be restricted in varying degrees for an indeterminate period of time but most often 2-6 weeks.  Finally, she understands that it is impossible to list every possible undesirable effect and that the condition for which surgery is done is not always cured or significantly improved, and in rare cases may be even worse.Ample time was given to answer all questions.  Paul Leronda Lewers, MD, FACOG [facility], [facility] 03/29/2019  9:21 AM  

## 2019-03-30 ENCOUNTER — Other Ambulatory Visit: Payer: Self-pay

## 2019-03-30 ENCOUNTER — Encounter
Admission: RE | Admit: 2019-03-30 | Discharge: 2019-03-30 | Disposition: A | Discharge status: Home / Self Care | Payer: Medicaid Other | Source: Ambulatory Visit | Attending: Obstetrics & Gynecology | Admitting: Obstetrics & Gynecology

## 2019-03-30 NOTE — Patient Instructions (Signed)
Your procedure is scheduled on: 04/05/19 Report to [facility]. To find out your arrival time please call [phone] between 1PM - 3PM on 04/04/19.  Remember: Instructions that are not followed completely may result in serious medical risk, up to and including death, or upon the discretion of your surgeon and anesthesiologist your surgery may need to be rescheduled.     _X__ 1. Do not eat food after midnight the night before your procedure.                 No gum chewing or hard candies. You may drink clear liquids up to 2 hours                 before you are scheduled to arrive for your surgery- DO not drink clear                 liquids within 2 hours of the start of your surgery.                 Clear Liquids include:  water, apple juice without pulp, clear carbohydrate                 drink such as Clearfast or Gatorade, Black Coffee or Tea (Do not add                 anything to coffee or tea). Diabetics water only  __X__2.  On the morning of surgery brush your teeth with toothpaste and water, you                 may rinse your mouth with mouthwash if you wish.  Do not swallow any              toothpaste of mouthwash.     _X__ 3.  No Alcohol for 24 hours before or after surgery.   _X__ 4.  Do Not Smoke or use e-cigarettes For 24 Hours Prior to Your Surgery.                 Do not use any chewable tobacco products for at least 6 hours prior to                 surgery.  ____  5.  Bring all medications with you on the day of surgery if instructed.   __X__  6.  Notify your doctor if there is any change in your medical condition      (cold, fever, infections).     Do not wear jewelry, make-up, hairpins, clips or nail polish. Do not wear lotions, powders, or perfumes.  Do not shave 48 hours prior to surgery. Men may shave face and neck. Do not bring valuables to the hospital.    [facility] is not responsible for any belongings or  valuables.  Contacts, dentures/partials or body piercings may not be worn into surgery. Bring a case for your contacts, glasses or hearing aids, a denture cup will be supplied. Leave your suitcase in the car. After surgery it may be brought to your room. For patients admitted to the hospital, discharge time is determined by your treatment team.   Patients discharged the day of surgery will not be allowed to drive home.   Please read over the following fact sheets that you were given:   MRSA Information  __X__ Take these medicines the morning of surgery with A SIP OF WATER:  1. none  2.   3.   4.  5.  6.  ____ Fleet Enema (as directed)   __X__ Use CHG Soap/SAGE wipes as directed  ____ Use inhalers on the day of surgery  ____ Stop metformin/Janumet/Farxiga 2 days prior to surgery    ____ Take 1/2 of usual insulin dose the night before surgery. No insulin the morning          of surgery.   ____ Stop Blood Thinners Coumadin/Plavix/Xarelto/Pleta/Pradaxa/Eliquis/Effient/Aspirin  on   Or contact your Surgeon, Cardiologist or Medical Doctor regarding  ability to stop your blood thinners  __X__ Stop Anti-inflammatories 7 days before surgery such as Advil, Ibuprofen, Motrin,  BC or Goodies Powder, Naprosyn, Naproxen, Aleve, Aspirin    __X__ Stop all herbal supplements, fish oil or vitamin E until after surgery.    ____ Bring C-Pap to the hospital.

## 2019-03-30 NOTE — Pre-Procedure Instructions (Signed)
See Progress Note on 03/29/19 for H&P.

## 2019-04-01 ENCOUNTER — Other Ambulatory Visit: Payer: Self-pay

## 2019-04-01 ENCOUNTER — Other Ambulatory Visit
Admission: RE | Admit: 2019-04-01 | Discharge: 2019-04-01 | Disposition: A | Discharge status: Home / Self Care | Payer: Medicaid Other | Source: Ambulatory Visit | Attending: Obstetrics & Gynecology | Admitting: Obstetrics & Gynecology

## 2019-04-01 DIAGNOSIS — Z01812 Encounter for preprocedural laboratory examination: Secondary | ICD-10-CM | POA: Diagnosis present

## 2019-04-01 DIAGNOSIS — Z20822 Contact with and (suspected) exposure to covid-19: Secondary | ICD-10-CM | POA: Diagnosis not present

## 2019-04-01 LAB — TYPE AND SCREEN
ABO/RH(D): B POS
Antibody Screen: NEGATIVE

## 2019-04-01 LAB — CBC
HCT: 41.8 % (ref 36.0–46.0)
Hemoglobin: 13.6 g/dL (ref 12.0–15.0)
MCH: 27.9 pg (ref 26.0–34.0)
MCHC: 32.5 g/dL (ref 30.0–36.0)
MCV: 85.8 fL (ref 80.0–100.0)
Platelets: 381 10*3/uL (ref 150–400)
RBC: 4.87 MIL/uL (ref 3.87–5.11)
RDW: 14.5 % (ref 11.5–15.5)
WBC: 11 10*3/uL — ABNORMAL HIGH (ref 4.0–10.5)
nRBC: 0 % (ref 0.0–0.2)

## 2019-04-01 LAB — SARS CORONAVIRUS 2 (TAT 6-24 HRS): SARS Coronavirus 2: NEGATIVE

## 2019-04-05 ENCOUNTER — Ambulatory Visit
Admission: RE | Admit: 2019-04-05 | Discharge: 2019-04-05 | Disposition: A | Discharge status: Home / Self Care | Payer: Medicaid Other | Attending: Obstetrics & Gynecology | Admitting: Obstetrics & Gynecology

## 2019-04-05 ENCOUNTER — Encounter: Payer: Self-pay | Admitting: Obstetrics & Gynecology

## 2019-04-05 ENCOUNTER — Ambulatory Visit: Payer: Medicaid Other | Admitting: Certified Registered"

## 2019-04-05 ENCOUNTER — Other Ambulatory Visit: Payer: Self-pay

## 2019-04-05 ENCOUNTER — Encounter
Admission: RE | Disposition: A | Discharge status: Home / Self Care | Payer: Self-pay | Source: Home / Self Care | Attending: Obstetrics & Gynecology

## 2019-04-05 DIAGNOSIS — R102 Pelvic and perineal pain: Secondary | ICD-10-CM | POA: Diagnosis not present

## 2019-04-05 DIAGNOSIS — N838 Other noninflammatory disorders of ovary, fallopian tube and broad ligament: Secondary | ICD-10-CM | POA: Diagnosis not present

## 2019-04-05 DIAGNOSIS — N921 Excessive and frequent menstruation with irregular cycle: Secondary | ICD-10-CM | POA: Diagnosis not present

## 2019-04-05 DIAGNOSIS — F17210 Nicotine dependence, cigarettes, uncomplicated: Secondary | ICD-10-CM | POA: Insufficient documentation

## 2019-04-05 DIAGNOSIS — N946 Dysmenorrhea, unspecified: Secondary | ICD-10-CM | POA: Diagnosis present

## 2019-04-05 DIAGNOSIS — N938 Other specified abnormal uterine and vaginal bleeding: Secondary | ICD-10-CM

## 2019-04-05 HISTORY — PX: TOTAL LAPAROSCOPIC HYSTERECTOMY WITH SALPINGECTOMY: SHX6742

## 2019-04-05 LAB — POCT PREGNANCY, URINE: Preg Test, Ur: NEGATIVE

## 2019-04-05 SURGERY — HYSTERECTOMY, TOTAL, LAPAROSCOPIC, WITH SALPINGECTOMY
Anesthesia: General

## 2019-04-05 MED ORDER — FLUORESCEIN SODIUM 10 % IV SOLN
INTRAVENOUS | Status: AC
  Filled 2019-04-05: qty 5

## 2019-04-05 MED ORDER — DEXMEDETOMIDINE HCL IN NACL 200 MCG/50ML IV SOLN
INTRAVENOUS | Status: DC | PRN
  Administered 2019-04-05: 12 ug via INTRAVENOUS

## 2019-04-05 MED ORDER — MIDAZOLAM HCL 2 MG/2ML IJ SOLN
INTRAMUSCULAR | Status: AC
  Filled 2019-04-05: qty 2

## 2019-04-05 MED ORDER — ONDANSETRON HCL 4 MG/2ML IJ SOLN
INTRAMUSCULAR | Status: AC
  Filled 2019-04-05: qty 4

## 2019-04-05 MED ORDER — PHENYLEPHRINE HCL (PRESSORS) 10 MG/ML IV SOLN
INTRAVENOUS | Status: DC | PRN
  Administered 2019-04-05 (×2): 100 ug via INTRAVENOUS

## 2019-04-05 MED ORDER — DEXAMETHASONE SODIUM PHOSPHATE 10 MG/ML IJ SOLN
INTRAMUSCULAR | Status: DC | PRN
  Administered 2019-04-05: 10 mg via INTRAVENOUS

## 2019-04-05 MED ORDER — OXYCODONE-ACETAMINOPHEN 5-325 MG PO TABS
ORAL_TABLET | ORAL | Status: AC
  Filled 2019-04-05: qty 1

## 2019-04-05 MED ORDER — FENTANYL CITRATE (PF) 100 MCG/2ML IJ SOLN
25.0000 ug | INTRAMUSCULAR | Status: DC | PRN
  Administered 2019-04-05 (×2): 25 ug via INTRAVENOUS

## 2019-04-05 MED ORDER — ONDANSETRON HCL 4 MG/2ML IJ SOLN
INTRAMUSCULAR | Status: DC | PRN
  Administered 2019-04-05 (×2): 4 mg via INTRAVENOUS

## 2019-04-05 MED ORDER — PROPOFOL 10 MG/ML IV BOLUS
INTRAVENOUS | Status: DC | PRN
  Administered 2019-04-05: 180 mg via INTRAVENOUS

## 2019-04-05 MED ORDER — ESMOLOL HCL 100 MG/10ML IV SOLN
INTRAVENOUS | Status: DC | PRN
  Administered 2019-04-05: 20 mg via INTRAVENOUS

## 2019-04-05 MED ORDER — HYDROMORPHONE HCL 1 MG/ML IJ SOLN
INTRAMUSCULAR | Status: DC | PRN
  Administered 2019-04-05: 1 mg via INTRAVENOUS

## 2019-04-05 MED ORDER — PROPOFOL 10 MG/ML IV BOLUS
INTRAVENOUS | Status: AC
  Filled 2019-04-05: qty 40

## 2019-04-05 MED ORDER — LIDOCAINE HCL (PF) 2 % IJ SOLN
INTRAMUSCULAR | Status: AC
  Filled 2019-04-05: qty 15

## 2019-04-05 MED ORDER — FENTANYL CITRATE (PF) 100 MCG/2ML IJ SOLN
INTRAMUSCULAR | Status: AC
  Filled 2019-04-05: qty 2

## 2019-04-05 MED ORDER — PHENYLEPHRINE HCL (PRESSORS) 10 MG/ML IV SOLN
INTRAVENOUS | Status: AC
  Filled 2019-04-05: qty 1

## 2019-04-05 MED ORDER — OXYCODONE-ACETAMINOPHEN 5-325 MG PO TABS
1.0000 | ORAL_TABLET | ORAL | Status: DC | PRN
  Administered 2019-04-05: 1 via ORAL

## 2019-04-05 MED ORDER — HYDROMORPHONE HCL 1 MG/ML IJ SOLN
INTRAMUSCULAR | Status: AC
  Filled 2019-04-05: qty 1

## 2019-04-05 MED ORDER — SODIUM CHLORIDE 0.9 % IV BOLUS: 500.0000 mL | Freq: Once | INTRAVENOUS | Status: DC

## 2019-04-05 MED ORDER — DEXMEDETOMIDINE HCL IN NACL 80 MCG/20ML IV SOLN
INTRAVENOUS | Status: AC
  Filled 2019-04-05: qty 20

## 2019-04-05 MED ORDER — CLINDAMYCIN PHOSPHATE 900 MG/50ML IV SOLN
900.0000 mg | Freq: Once | INTRAVENOUS | Status: AC
  Administered 2019-04-05: 900 mg via INTRAVENOUS

## 2019-04-05 MED ORDER — OXYCODONE-ACETAMINOPHEN 5-325 MG PO TABS
ORAL_TABLET | ORAL | Status: AC
  Administered 2019-04-05: 1 via ORAL
  Filled 2019-04-05: qty 1

## 2019-04-05 MED ORDER — LIDOCAINE HCL (CARDIAC) PF 100 MG/5ML IV SOSY
PREFILLED_SYRINGE | INTRAVENOUS | Status: DC | PRN
  Administered 2019-04-05: 100 mg via INTRAVENOUS

## 2019-04-05 MED ORDER — SUGAMMADEX SODIUM 500 MG/5ML IV SOLN
INTRAVENOUS | Status: DC | PRN
  Administered 2019-04-05: 400 mg via INTRAVENOUS

## 2019-04-05 MED ORDER — FAMOTIDINE 20 MG PO TABS: 20.0000 mg | ORAL_TABLET | Freq: Once | ORAL | Status: AC

## 2019-04-05 MED ORDER — GLYCOPYRROLATE 0.2 MG/ML IJ SOLN
INTRAMUSCULAR | Status: AC
  Filled 2019-04-05: qty 1

## 2019-04-05 MED ORDER — OXYCODONE-ACETAMINOPHEN 5-325 MG PO TABS: 1.0000 | ORAL_TABLET | Freq: Once | ORAL | Status: AC

## 2019-04-05 MED ORDER — ONDANSETRON HCL 4 MG/2ML IJ SOLN: 4.0000 mg | Freq: Once | INTRAMUSCULAR | Status: DC | PRN

## 2019-04-05 MED ORDER — BUPIVACAINE HCL (PF) 0.5 % IJ SOLN
INTRAMUSCULAR | Status: AC
  Filled 2019-04-05: qty 30

## 2019-04-05 MED ORDER — ROCURONIUM BROMIDE 100 MG/10ML IV SOLN
INTRAVENOUS | Status: DC | PRN
  Administered 2019-04-05 (×2): 10 mg via INTRAVENOUS
  Administered 2019-04-05: 50 mg via INTRAVENOUS

## 2019-04-05 MED ORDER — BUPIVACAINE HCL (PF) 0.5 % IJ SOLN
INTRAMUSCULAR | Status: DC | PRN
  Administered 2019-04-05: 10 mL

## 2019-04-05 MED ORDER — DEXAMETHASONE SODIUM PHOSPHATE 10 MG/ML IJ SOLN
INTRAMUSCULAR | Status: AC
  Filled 2019-04-05: qty 1

## 2019-04-05 MED ORDER — ACETAMINOPHEN 650 MG RE SUPP
650.0000 mg | RECTAL | Status: DC | PRN
  Filled 2019-04-05: qty 1

## 2019-04-05 MED ORDER — GLYCOPYRROLATE 0.2 MG/ML IJ SOLN
INTRAMUSCULAR | Status: DC | PRN
  Administered 2019-04-05: .2 mg via INTRAVENOUS

## 2019-04-05 MED ORDER — ESMOLOL HCL 100 MG/10ML IV SOLN
INTRAVENOUS | Status: AC
  Filled 2019-04-05: qty 10

## 2019-04-05 MED ORDER — ACETAMINOPHEN 325 MG PO TABS: 650.0000 mg | ORAL_TABLET | ORAL | Status: DC | PRN

## 2019-04-05 MED ORDER — LACTATED RINGERS IV SOLN: INTRAVENOUS | Status: DC

## 2019-04-05 MED ORDER — PROPOFOL 10 MG/ML IV BOLUS
INTRAVENOUS | Status: AC
  Filled 2019-04-05: qty 20

## 2019-04-05 MED ORDER — ACETAMINOPHEN 10 MG/ML IV SOLN
INTRAVENOUS | Status: DC | PRN
  Administered 2019-04-05: 1000 mg via INTRAVENOUS

## 2019-04-05 MED ORDER — OXYCODONE-ACETAMINOPHEN 5-325 MG PO TABS: 1.0000 | ORAL_TABLET | ORAL | 0 refills | Status: DC | PRN

## 2019-04-05 MED ORDER — LACTATED RINGERS IV BOLUS: 500.0000 mL | Freq: Once | INTRAVENOUS | Status: DC

## 2019-04-05 MED ORDER — FAMOTIDINE 20 MG PO TABS
ORAL_TABLET | ORAL | Status: AC
  Administered 2019-04-05: 20 mg via ORAL
  Filled 2019-04-05: qty 1

## 2019-04-05 MED ORDER — MIDAZOLAM HCL 2 MG/2ML IJ SOLN
INTRAMUSCULAR | Status: DC | PRN
  Administered 2019-04-05: 2 mg via INTRAVENOUS

## 2019-04-05 MED ORDER — FENTANYL CITRATE (PF) 100 MCG/2ML IJ SOLN: 25.0000 ug | INTRAMUSCULAR | Status: DC | PRN

## 2019-04-05 MED ORDER — KETOROLAC TROMETHAMINE 30 MG/ML IJ SOLN
INTRAMUSCULAR | Status: DC | PRN
  Administered 2019-04-05: 30 mg via INTRAVENOUS

## 2019-04-05 MED ORDER — ACETAMINOPHEN 10 MG/ML IV SOLN: INTRAVENOUS | Status: DC | PRN

## 2019-04-05 MED ORDER — FENTANYL CITRATE (PF) 100 MCG/2ML IJ SOLN
INTRAMUSCULAR | Status: AC
  Administered 2019-04-05: 25 ug via INTRAVENOUS
  Filled 2019-04-05: qty 2

## 2019-04-05 MED ORDER — FENTANYL CITRATE (PF) 100 MCG/2ML IJ SOLN
INTRAMUSCULAR | Status: DC | PRN
  Administered 2019-04-05: 100 ug via INTRAVENOUS

## 2019-04-05 MED ORDER — CLINDAMYCIN PHOSPHATE 900 MG/50ML IV SOLN
INTRAVENOUS | Status: AC
  Filled 2019-04-05: qty 50

## 2019-04-05 SURGICAL SUPPLY — 51 items
BAG URINE DRAIN 2000ML AR STRL (UROLOGICAL SUPPLIES) ×3 IMPLANT
BLADE SURG SZ11 CARB STEEL (BLADE) ×3 IMPLANT
CANISTER SUCT 1200ML W/VALVE (MISCELLANEOUS) ×3 IMPLANT
CATH FOLEY 2WAY  5CC 16FR (CATHETERS) ×2
CATH URTH 16FR FL 2W BLN LF (CATHETERS) ×1 IMPLANT
CHLORAPREP W/TINT 26 (MISCELLANEOUS) ×3 IMPLANT
COVER WAND RF STERILE (DRAPES) ×3 IMPLANT
DEFOGGER SCOPE WARMER CLEARIFY (MISCELLANEOUS) ×3 IMPLANT
DERMABOND ADVANCED (GAUZE/BANDAGES/DRESSINGS) ×2
DERMABOND ADVANCED .7 DNX12 (GAUZE/BANDAGES/DRESSINGS) ×1 IMPLANT
DEVICE SUTURE ENDOST 10MM (ENDOMECHANICALS) ×6 IMPLANT
DRAPE CAMERA CLOSED 9X96 (DRAPES) IMPLANT
DRSG TEGADERM 2-3/8X2-3/4 SM (GAUZE/BANDAGES/DRESSINGS) IMPLANT
GLOVE BIO SURGEON STRL SZ8 (GLOVE) ×15 IMPLANT
GLOVE INDICATOR 8.0 STRL GRN (GLOVE) ×3 IMPLANT
GOWN STRL REUS W/ TWL LRG LVL3 (GOWN DISPOSABLE) ×1 IMPLANT
GOWN STRL REUS W/ TWL XL LVL3 (GOWN DISPOSABLE) ×2 IMPLANT
GOWN STRL REUS W/TWL LRG LVL3 (GOWN DISPOSABLE) ×2
GOWN STRL REUS W/TWL XL LVL3 (GOWN DISPOSABLE) ×4
GRASPER SUT TROCAR 14GX15 (MISCELLANEOUS) ×3 IMPLANT
IRRIGATION STRYKERFLOW (MISCELLANEOUS) ×1 IMPLANT
IRRIGATOR STRYKERFLOW (MISCELLANEOUS) ×3
IV LACTATED RINGERS 1000ML (IV SOLUTION) ×6 IMPLANT
KIT PINK PAD W/HEAD ARE REST (MISCELLANEOUS) ×3
KIT PINK PAD W/HEAD ARM REST (MISCELLANEOUS) ×1 IMPLANT
KIT TURNOVER CYSTO (KITS) ×3 IMPLANT
LABEL OR SOLS (LABEL) ×3 IMPLANT
MANIPULATOR VCARE LG CRV RETR (MISCELLANEOUS) IMPLANT
MANIPULATOR VCARE SML CRV RETR (MISCELLANEOUS) IMPLANT
MANIPULATOR VCARE STD CRV RETR (MISCELLANEOUS) ×3 IMPLANT
NEEDLE VERESS 14GA 120MM (NEEDLE) ×3 IMPLANT
NS IRRIG 500ML POUR BTL (IV SOLUTION) ×3 IMPLANT
OCCLUDER COLPOPNEUMO (BALLOONS) ×3 IMPLANT
PACK GYN LAPAROSCOPIC (MISCELLANEOUS) ×3 IMPLANT
PAD OB MATERNITY 4.3X12.25 (PERSONAL CARE ITEMS) ×3 IMPLANT
PAD PREP 24X41 OB/GYN DISP (PERSONAL CARE ITEMS) ×3 IMPLANT
PORT ACCESS TROCAR AIRSEAL 12 (TROCAR) ×1 IMPLANT
PORT ACCESS TROCAR AIRSEAL 5M (TROCAR) ×2
SCISSORS METZENBAUM CVD 33 (INSTRUMENTS) ×3 IMPLANT
SET CYSTO W/LG BORE CLAMP LF (SET/KITS/TRAYS/PACK) ×3 IMPLANT
SET TRI-LUMEN FLTR TB AIRSEAL (TUBING) ×3 IMPLANT
SHEARS HARMONIC ACE PLUS 36CM (ENDOMECHANICALS) ×3 IMPLANT
SLEEVE ENDOPATH XCEL 5M (ENDOMECHANICALS) ×3 IMPLANT
SPONGE GAUZE 2X2 8PLY STER LF (GAUZE/BANDAGES/DRESSINGS)
SPONGE GAUZE 2X2 8PLY STRL LF (GAUZE/BANDAGES/DRESSINGS) IMPLANT
SUT ENDO VLOC 180-0-8IN (SUTURE) ×9 IMPLANT
SUT VIC AB 0 CT1 36 (SUTURE) ×3 IMPLANT
SUT VIC AB 4-0 FS2 27 (SUTURE) ×3 IMPLANT
SYR 10ML LL (SYRINGE) ×3 IMPLANT
SYR 50ML LL SCALE MARK (SYRINGE) ×3 IMPLANT
TROCAR XCEL NON-BLD 5MMX100MML (ENDOMECHANICALS) ×3 IMPLANT

## 2019-04-05 NOTE — Progress Notes (Signed)
Patient cannot urinate, called Dr. Tiburcio Pea and he stated to give her a LR bolus because she did not produce much urine in the OR. I will continue to monitor the patient.

## 2019-04-05 NOTE — Interval H&P Note (Signed)
History and Physical Interval Note:  04/05/2019 8:39 AM  Holly Berry  has presented today for surgery, with the diagnosis of Menometrorrhagia N92.1.  The various methods of treatment have been discussed with the patient and family. After consideration of risks, benefits and other options for treatment, the patient has consented to  Procedure(s): TOTAL LAPAROSCOPIC HYSTERECTOMY WITH BILATERAL SALPINGECTOMY (N/A) and Cystoscopy as a surgical intervention.  The patient's history has been reviewed, patient examined, no change in status, stable for surgery.  I have reviewed the patient's chart and labs.  Questions were answered to the patient's satisfaction.     Letitia Libra

## 2019-04-05 NOTE — Op Note (Signed)
Operative Report:  PRE-OP DIAGNOSIS: Menometrorrhagia N92.1   POST-OP DIAGNOSIS: Menometrorrhagia N92.1   PROCEDURE: Procedure(s): TOTAL LAPAROSCOPIC HYSTERECTOMY WITH BILATERAL SALPINGECTOMY  SURGEON: Annamarie Major, MD, FACOG  ASSISTANT: Dr Jean Rosenthal, No other capable assistant available, in surgery requiring high level assistant.   ANESTHESIA: General endotracheal anesthesia  ESTIMATED BLOOD LOSS:  20 mL  SPECIMENS: Uterus, Tubes.  COMPLICATIONS: None  DISPOSITION: stable to PACU  FINDINGS: Intraabdominal adhesions were not noted. Normal ovaries, no cysts.  PROCEDURE:  The patient was taken to the OR where anesthesia was administed. She was prepped and draped in the normal sterile fashion in the dorsal lithotomy position in the [place] stirrups. A time out was performed. A Graves speculum was inserted, the cervix was grasped with a single tooth tenaculum and the endometrial cavity was sounded. The cervix was progressively dilated to a size 18 [place] with [employer] dilators. A V-Care uterine manipulator was inserted in the usual fashion without incident. Gloves were changed and attention was turned to the abdomen.   An infraumbilical transverse 84mm skin incision was made with the scalpel after local anesthesia applied to the skin. A Veress-step needle was inserted in the usual fashion and confirmed using the hanging drop technique. A pneumoperitoneum was obtained by insufflation of CO2 (opening pressure of ) to . A diagnostic laparoscopy was performed yielding the previously described findings. Attention was turned to the left lower quadrant where after visualization of the inferior epigastric vessels a 40mm skin incision was made with the scalpel. A 5 mm laparoscopic port was inserted. The same procedure was repeated in the right lower quadrant with a 21mm trocar. Attention was turned to the left aspect of the uterus, where after visualization of the ureter, the round ligament was  coagulated and transected using the 2mm Harmonic Scapel. The anterior and posterior leafs of the broad ligament were dissected off as the anterior one was coagulated and transected in a caudal direction towards the cuff of the uterine manipulator.  Attention was then turned to the left fallopian tube which was recognized by visualization of the fimbria. The tube is excised to its attachment to the uterus. The uterine-ovarian ligament and its blood vessels were carefully coagulated and transected using the Harmonic scapel.  Attention was turned to the right aspect of the uterus where the same procedure was performed.  The vesicouterine reflection of the peritoneum was dissected with the harmonic scapel and the bladder flap was created bluntly.  The uterine vessels were coagulated and transected bilaterally using first bipolar cautery and then the harmonic scapel. A 360 degree, circumferential colpotomy was done to completely amputate the uterus with cervix and tubes. Once the specimen was amputated it was delivered through the vagina.   The colpotomy was repaired in a simple running fashion using a delayed absorbable suture with an endo-stitch device.  Vaginal exam confirms complete closure.  The cavity was copiously irrigated. A survey of the pelvic cavity revealed adequate hemostasis and no injury to bowel, bladder, or ureter.   A diagnostic cystoscopy was performed using saline distension of bladder with no lesions or injuries noted.  Bilateral urine flow from each ureteral orifice is visualized.  At this point the procedure was finalized. Right lower quadrant fascia incision is closed with a vicryl suture using the fascia closure device. All the instruments were removed from the patient's body. Gas was expelled and patient is leveled.  Incisions are closed with skin adhesive.    Patient goes to recovery room in stable condition.  All sponge, instrument, and needle counts are correct x2.     Barnett Applebaum, MD, [facility], [facility] 04/05/2019  10:46 AM

## 2019-04-05 NOTE — Progress Notes (Signed)
Dr. Tiburcio Pea came to talk to the patient concerning her urinary problems and flank pain. He explained to her about the Clindamycin she had should cover the bladder issue if not he wants her to call him. Patient stated she understood and would call if needed.

## 2019-04-05 NOTE — Anesthesia Postprocedure Evaluation (Signed)
Anesthesia Post Note  Patient: Holly Berry  Procedure(s) Performed: TOTAL LAPAROSCOPIC HYSTERECTOMY WITH BILATERAL SALPINGECTOMY (N/A )  Patient location during evaluation: PACU Anesthesia Type: General Level of consciousness: awake and alert Pain management: pain level controlled Vital Signs Assessment: post-procedure vital signs reviewed and stable Respiratory status: spontaneous breathing and respiratory function stable Cardiovascular status: stable Anesthetic complications: no     Last Vitals:  Vitals:   04/05/19 1111 04/05/19 1115  BP:    Pulse: 82 69  Resp: 13 14  Temp:    SpO2: 99% 98%    Last Pain:  Vitals:   04/05/19 1115  TempSrc:   PainSc: 7                  Candia Kingsbury K

## 2019-04-05 NOTE — Progress Notes (Signed)
Patient stated she has 7/10 pain after taking percocet in PACU so I called [facility] he said I could give another percocet

## 2019-04-05 NOTE — Addendum Note (Signed)
Addendum  created 04/05/19 1158 by Mohammed Kindle, CRNA   Flowsheet accepted, Intraprocedure Flowsheets edited

## 2019-04-05 NOTE — Anesthesia Procedure Notes (Signed)
Procedure Name: Intubation Performed by: Fletcher-Harrison, Ousmane Seeman, CRNA Pre-anesthesia Checklist: Patient identified, Emergency Drugs available, Suction available and Patient being monitored Patient Re-evaluated:Patient Re-evaluated prior to induction Oxygen Delivery Method: Circle system utilized Preoxygenation: Pre-oxygenation with 100% oxygen Induction Type: IV induction Ventilation: Mask ventilation without difficulty Laryngoscope Size: McGraph and 3 Grade View: Grade I Tube type: Oral Tube size: 7.0 mm Number of attempts: 1 Airway Equipment and Method: Stylet Placement Confirmation: ETT inserted through vocal cords under direct vision,  positive ETCO2,  CO2 detector and breath sounds checked- equal and bilateral Secured at: 21 cm Tube secured with: Tape Dental Injury: Teeth and Oropharynx as per pre-operative assessment        

## 2019-04-05 NOTE — Discharge Instructions (Signed)
AMBULATORY SURGERY  DISCHARGE INSTRUCTIONS   1) The drugs that you were given will stay in your system until tomorrow so for the next 24 hours you should not:  A) Drive an automobile B) Make any legal decisions C) Drink any alcoholic beverage   2) You may resume regular meals tomorrow.  Today it is better to start with liquids and gradually work up to solid foods.  You may eat anything you prefer, but it is better to start with liquids, then soup and crackers, and gradually work up to solid foods.   3) Please notify your doctor immediately if you have any unusual bleeding, trouble breathing, redness and pain at the surgery site, drainage, fever, or pain not relieved by medication.    4) Additional Instructions:        Please contact your physician with any problems or Same Day Surgery at [phone], Monday through Friday 6 am to 4 pm, or [place] [facility] number at [phone].Total Laparoscopic Hysterectomy, Care After This sheet gives you information about how to care for yourself after your procedure. Your health care provider may also give you more specific instructions. If you have problems or questions, contact your health care provider. What can I expect after the procedure? After the procedure, it is common to have:  Pain and bruising around your incisions.  A sore throat, if a breathing tube was used during surgery.  Fatigue.  Poor appetite.  Less interest in sex. If your ovaries were also removed, it is also common to have symptoms of menopause such as hot flashes, night sweats, and lack of sleep (insomnia). Follow these instructions at home: Bathing  Do not take baths, swim, or use a hot tub until your health care provider approves. You may need to only take showers for 2-3 weeks.  Keep your bandage (dressing) dry until your health care provider says it can be removed. Incision care   Follow instructions from your health care provider about  how to take care of your incisions. Make sure you: ? Wash your hands with soap and water before you change your dressing. If soap and water are not available, use hand sanitizer. ? Change your dressing as told by your health care provider. ? Leave stitches (sutures), skin glue, or adhesive strips in place. These skin closures may need to stay in place for 2 weeks or longer. If adhesive strip edges start to loosen and curl up, you may trim the loose edges. Do not remove adhesive strips completely unless your health care provider tells you to do that.  Check your incision area every day for signs of infection. Check for: ? Redness, swelling, or pain. ? Fluid or blood. ? Warmth. ? Pus or a bad smell. Activity  Get plenty of rest and sleep.  Do not lift anything that is heavier than 10 lbs (4.5 kg) for one month after surgery, or as long as told by your health care provider.  Do not drive or use heavy machinery while taking prescription pain medicine.  Do not drive for 24 hours if you were given a medicine to help you relax (sedative).  Return to your normal activities as told by your health care provider. Ask your health care provider what activities are safe for you. Lifestyle   Do not use any products that contain nicotine or tobacco, such as cigarettes and e-cigarettes. These can delay healing. If you need help quitting, ask your health care provider.  Do not drink   alcohol until your health care provider approves. General instructions  Do not douche, use tampons, or have sex for at least 6 weeks, or as told by your health care provider.  Take over-the-counter and prescription medicines only as told by your health care provider.  To monitor yourself for a fever, take your temperature at least once a day during recovery.  If you struggle with physical or emotional changes after your procedure, speak with your health care provider or a therapist.  To prevent or treat constipation  while you are taking prescription pain medicine, your health care provider may recommend that you: ? Drink enough fluid to keep your urine clear or pale yellow. ? Take over-the-counter or prescription medicines. ? Eat foods that are high in fiber, such as fresh fruits and vegetables, whole grains, and beans. ? Limit foods that are high in fat and processed sugars, such as fried and sweet foods.  Keep all follow-up visits as told by your health care provider. This is important. Contact a health care provider if:  You have chills or a fever.  You have redness, swelling, or pain around an incision.  You have fluid or blood coming from an incision.  Your incision feels warm to the touch.  You have pus or a bad smell coming from an incision.  An incision breaks open.  You feel dizzy or light-headed.  You have pain or bleeding when you urinate.  You have diarrhea, nausea, or vomiting that does not go away.  You have abnormal vaginal discharge.  You have a rash.  You have pain that does not get better with medicine. Get help right away if:  You have a fever and your symptoms suddenly get worse.  You have severe abdominal pain.  You have chest pain.  You have shortness of breath.  You faint.  You have pain, swelling, or redness on your leg.  You have heavy vaginal bleeding with blood clots. Summary  After the procedure it is common to have abdominal pain. Your provider will give you medication for this.  Do not take baths, swim, or use a hot tub until your health care provider approves.  Do not lift anything that is heavier than 10 lbs (4.5 kg) for one month after surgery, or as long as told by your health care provider.  Notify your provider if you have any signs or symptoms of infection after the procedure. This information is not intended to replace advice given to you by your health care provider. Make sure you discuss any questions you have with your health care  provider. Document Revised: 12/26/2016 Document Reviewed: 03/26/2016 Elsevier Patient Education  2020 Elsevier Inc.  

## 2019-04-05 NOTE — Transfer of Care (Addendum)
Immediate Anesthesia Transfer of Care Note  Patient: Holly Berry  Procedure(s) Performed: TOTAL LAPAROSCOPIC HYSTERECTOMY WITH BILATERAL SALPINGECTOMY (N/A )  Patient Location: PACU  Anesthesia Type:General  Level of Consciousness: awake, alert , oriented and patient cooperative  Airway & Oxygen Therapy: Patient Spontanous Breathing and Patient connected to face mask oxygen  Post-op Assessment: Report given to RN and Post -op Vital signs reviewed and stable  Post vital signs: Reviewed and stable  Last Vitals:  Vitals Value Taken Time  BP 128/86 04/05/19 1052  Temp 36.4 C 04/05/19 1052  Pulse 76 04/05/19 1054  Resp 15 04/05/19 1054  SpO2 100 % 04/05/19 1054  Vitals shown include unvalidated device data.  Last Pain:  Vitals:   04/05/19 0804  TempSrc: Temporal  PainSc: 0-No pain         Complications: No apparent anesthesia complications

## 2019-04-05 NOTE — Anesthesia Preprocedure Evaluation (Signed)
Anesthesia Evaluation  Patient identified by MRN, date of birth, ID band Patient awake    Reviewed: Allergy & Precautions, NPO status , Patient's Chart, lab work & pertinent test results  History of Anesthesia Complications Negative for: history of anesthetic complications  Airway Mallampati: III       Dental   Pulmonary neg sleep apnea, neg COPD, Current Smoker and Patient abstained from smoking.,           Cardiovascular (-) hypertension(-) Past MI and (-) CHF (-) dysrhythmias (-) Valvular Problems/Murmurs     Neuro/Psych neg Seizures    GI/Hepatic Neg liver ROS, neg GERD  ,  Endo/Other  neg diabetes  Renal/GU negative Renal ROS     Musculoskeletal   Abdominal   Peds  Hematology   Anesthesia Other Findings   Reproductive/Obstetrics                             Anesthesia Physical Anesthesia Plan  ASA: II  Anesthesia Plan: General   Post-op Pain Management:    Induction: Intravenous  PONV Risk Score and Plan: 2 and Dexamethasone and Ondansetron  Airway Management Planned: Oral ETT  Additional Equipment:   Intra-op Plan:   Post-operative Plan:   Informed Consent: I have reviewed the patients History and Physical, chart, labs and discussed the procedure including the risks, benefits and alternatives for the proposed anesthesia with the patient or authorized representative who has indicated his/her understanding and acceptance.       Plan Discussed with:   Anesthesia Plan Comments:         Anesthesia Quick Evaluation

## 2019-04-07 ENCOUNTER — Emergency Department
Admission: EM | Admit: 2019-04-07 | Discharge: 2019-04-07 | Disposition: A | Discharge status: Home / Self Care | Payer: Medicaid Other | Attending: Emergency Medicine | Admitting: Emergency Medicine

## 2019-04-07 ENCOUNTER — Emergency Department: Payer: Medicaid Other

## 2019-04-07 ENCOUNTER — Ambulatory Visit (INDEPENDENT_AMBULATORY_CARE_PROVIDER_SITE_OTHER): Payer: Medicaid Other | Admitting: Obstetrics & Gynecology

## 2019-04-07 ENCOUNTER — Encounter: Payer: Self-pay | Admitting: Emergency Medicine

## 2019-04-07 ENCOUNTER — Encounter: Payer: Self-pay | Admitting: Obstetrics & Gynecology

## 2019-04-07 ENCOUNTER — Other Ambulatory Visit: Payer: Self-pay

## 2019-04-07 DIAGNOSIS — R1031 Right lower quadrant pain: Secondary | ICD-10-CM | POA: Diagnosis not present

## 2019-04-07 DIAGNOSIS — Z90710 Acquired absence of both cervix and uterus: Secondary | ICD-10-CM

## 2019-04-07 DIAGNOSIS — R7401 Elevation of levels of liver transaminase levels: Secondary | ICD-10-CM | POA: Diagnosis not present

## 2019-04-07 DIAGNOSIS — R52 Pain, unspecified: Secondary | ICD-10-CM

## 2019-04-07 DIAGNOSIS — F17210 Nicotine dependence, cigarettes, uncomplicated: Secondary | ICD-10-CM | POA: Insufficient documentation

## 2019-04-07 DIAGNOSIS — G8918 Other acute postprocedural pain: Secondary | ICD-10-CM | POA: Diagnosis present

## 2019-04-07 DIAGNOSIS — R1084 Generalized abdominal pain: Secondary | ICD-10-CM | POA: Diagnosis not present

## 2019-04-07 DIAGNOSIS — M545 Low back pain: Secondary | ICD-10-CM

## 2019-04-07 DIAGNOSIS — R39198 Other difficulties with micturition: Secondary | ICD-10-CM | POA: Diagnosis not present

## 2019-04-07 DIAGNOSIS — R102 Pelvic and perineal pain: Secondary | ICD-10-CM | POA: Diagnosis not present

## 2019-04-07 DIAGNOSIS — Z20822 Contact with and (suspected) exposure to covid-19: Secondary | ICD-10-CM | POA: Diagnosis not present

## 2019-04-07 LAB — ACETAMINOPHEN LEVEL: Acetaminophen (Tylenol), Serum: <10 ug/mL — ABNORMAL LOW (ref 10–30)

## 2019-04-07 LAB — PROTIME-INR
INR: 1 (ref 0.8–1.2)
Prothrombin Time: 12.9 seconds (ref 11.4–15.2)

## 2019-04-07 LAB — COMPREHENSIVE METABOLIC PANEL
ALT: 566 U/L — ABNORMAL HIGH (ref 0–44)
AST: 598 U/L — ABNORMAL HIGH (ref 15–41)
Albumin: 3.5 g/dL (ref 3.5–5.0)
Alkaline Phosphatase: 203 U/L — ABNORMAL HIGH (ref 38–126)
Anion gap: 8 (ref 5–15)
BUN: 8 mg/dL (ref 6–20)
CO2: 28 mmol/L (ref 22–32)
Calcium: 9 mg/dL (ref 8.9–10.3)
Chloride: 102 mmol/L (ref 98–111)
Creatinine, Ser: 0.72 mg/dL (ref 0.44–1.00)
GFR calc Af Amer: >60 mL/min (ref >60)
GFR calc non Af Amer: >60 mL/min (ref >60)
Glucose, Bld: 91 mg/dL (ref 70–99)
Potassium: 4 mmol/L (ref 3.5–5.1)
Sodium: 138 mmol/L (ref 135–145)
Total Bilirubin: 0.7 mg/dL (ref 0.3–1.2)
Total Protein: 7.2 g/dL (ref 6.5–8.1)

## 2019-04-07 LAB — CBC
HCT: 40.4 % (ref 36.0–46.0)
Hemoglobin: 13 g/dL (ref 12.0–15.0)
MCH: 27.6 pg (ref 26.0–34.0)
MCHC: 32.2 g/dL (ref 30.0–36.0)
MCV: 85.8 fL (ref 80.0–100.0)
Platelets: 335 10*3/uL (ref 150–400)
RBC: 4.71 MIL/uL (ref 3.87–5.11)
RDW: 14.4 % (ref 11.5–15.5)
WBC: 9.3 10*3/uL (ref 4.0–10.5)
nRBC: 0 % (ref 0.0–0.2)

## 2019-04-07 LAB — TYPE AND SCREEN
ABO/RH(D): B POS
Antibody Screen: NEGATIVE

## 2019-04-07 LAB — APTT: aPTT: 26 seconds (ref 24–36)

## 2019-04-07 LAB — SURGICAL PATHOLOGY

## 2019-04-07 LAB — LIPASE, BLOOD: Lipase: 46 U/L (ref 11–51)

## 2019-04-07 MED ORDER — DROPERIDOL 2.5 MG/ML IJ SOLN
2.5000 mg | Freq: Once | INTRAMUSCULAR | Status: AC
  Administered 2019-04-07: 2.5 mg via INTRAVENOUS
  Filled 2019-04-07: qty 2

## 2019-04-07 MED ORDER — IOHEXOL 300 MG/ML  SOLN
100.0000 mL | Freq: Once | INTRAMUSCULAR | Status: AC | PRN
  Administered 2019-04-07: 100 mL via INTRAVENOUS

## 2019-04-07 MED ORDER — SODIUM CHLORIDE 0.9 % IV BOLUS
1000.0000 mL | Freq: Once | INTRAVENOUS | Status: AC
  Administered 2019-04-07: 1000 mL via INTRAVENOUS

## 2019-04-07 MED ORDER — FENTANYL CITRATE (PF) 100 MCG/2ML IJ SOLN
50.0000 ug | INTRAMUSCULAR | Status: DC | PRN
  Administered 2019-04-07: 50 ug via INTRAVENOUS
  Filled 2019-04-07: qty 2

## 2019-04-07 NOTE — ED Triage Notes (Signed)
Patient presents to the ED from OB-GYN office and Dr. Tiburcio Pea.  Dr. Tiburcio Pea called ahead and stated that patient was seen in his office today for follow-up from hysterectomy on March 9th.  Per MD, patient's pain is much more severe than to be expected post hysterectomy.  Patient is tearful during triage.  Patient appears very uncomfortable.  Patient denies vomiting today, reports some nausea.  Patient denies diarrhea.  Patient states she has not had a bowel movement since her surgery.

## 2019-04-07 NOTE — Telephone Encounter (Signed)
Pt called after hour nurse 04/06/19 6:19pm c/o having severe pain and pressure on the right side of her abdomen; had partial hyst 3/9; having difficulty urinating; they had issues getting the urinary catheter out; feels she only is able to urinate a little bit at a time and feels bladder is full; no urine or vag bleeding. 9/10 on pain scale of abd pain; pain is near incision site also; no drainage or swelling at incision sites.  [phone]

## 2019-04-07 NOTE — Discharge Instructions (Signed)
As we discussed it is extremely important that you follow up with your doctor to have your liver enzymes rechecked. If things are getting worse you risk permanently damaging your liver. This could be fatal. Please seek medical attention for any high fevers, chest pain, shortness of breath, change in behavior, persistent vomiting, bloody stool or any other new or concerning symptoms.

## 2019-04-07 NOTE — Telephone Encounter (Signed)
Appt today

## 2019-04-07 NOTE — ED Provider Notes (Signed)
[facility] Emergency Department Provider Note  ____________________________________________   I have reviewed the triage vital signs and the nursing notes.   HISTORY  Chief Complaint Post-op Problem   History limited by: Not Limited   HPI Holly Berry is a 34 y.o. female who presents to the emergency department today because of concerns for postoperative pain.  The patient had a hysterectomy performed 2 days ago.  She states she had this because of heavy menses.  Patient states that since the surgery she has had pain on the right side of her abdomen.  She did follow-up with her OB/GYN today who sent her to the emergency department given level of pain.  She has tried Percocets at home without any significant relief.  In addition of the pain she has noticed some difficulty with urination.  She states she is having a hard time urinating does hurt when she urinates.  Patient denies similar pain in the past.  States she has UTIs in the past although they presented differently.  Records reviewed. Per medical record review patient has a history of ovarian cyst.  Past Medical History:  Diagnosis Date  . MRSA (methicillin resistant staph aureus) culture positive 07/2018  . Ovarian cyst     Patient Active Problem List   Diagnosis Date Noted  . Pelvic pain 11/18/2018  . Menometrorrhagia 11/15/2018  . Dysmenorrhea 11/15/2018  . Endometriosis 11/15/2018  . Postoperative state 09/17/2012  . History of cesarean section 09/17/2012    Past Surgical History:  Procedure Laterality Date  . CESAREAN SECTION    . CHOLECYSTECTOMY    . DILATION AND CURETTAGE OF UTERUS    . FOOT SURGERY Left   . HAND SURGERY Right   . HYSTEROSCOPY WITH D & C N/A 11/23/2018   Procedure: DILATATION AND CURETTAGE;  Surgeon: Gae Dry, MD;  Location: [facility] ORS;  Service: Gynecology;  Laterality: N/A;  . KNEE SURGERY Right   . KNEE SURGERY Right   . KNEE SURGERY Right   .  LAPAROSCOPIC OVARIAN CYSTECTOMY Left 11/23/2018   Procedure: LAPAROSCOPIC OVARIAN CYSTECTOMY;  Surgeon: Gae Dry, MD;  Location: [facility] ORS;  Service: Gynecology;  Laterality: Left;  . TOTAL LAPAROSCOPIC HYSTERECTOMY WITH SALPINGECTOMY N/A 04/05/2019   Procedure: TOTAL LAPAROSCOPIC HYSTERECTOMY WITH BILATERAL SALPINGECTOMY;  Surgeon: Gae Dry, MD;  Location: [facility] ORS;  Service: Gynecology;  Laterality: N/A;  . TUBAL LIGATION    . TUBAL LIGATION      Prior to Admission medications   Medication Sig Start Date End Date Taking? Authorizing Provider  acetaminophen (TYLENOL) 325 MG tablet Take 650 mg by mouth every 6 (six) hours as needed.    [provider]  ibuprofen (ADVIL) 800 MG tablet Take 400-800 mg by mouth every 8 (eight) hours as needed (for pain.).    [provider]  Ibuprofen-Acetaminophen (ADVIL DUAL ACTION) 125-250 MG TABS Take 2 tablets by mouth 3 (three) times daily as needed (pain).    [provider]  oxyCODONE-acetaminophen (PERCOCET/ROXICET) 5-325 MG tablet Take 1 tablet by mouth every 4 (four) hours as needed for moderate pain. 04/05/19   Gae Dry, MD  albuterol (PROVENTIL HFA;VENTOLIN HFA) 108 (90 Base) MCG/ACT inhaler Inhale 2 puffs into the lungs every 6 (six) hours as needed for wheezing or shortness of breath. 05/04/16 08/18/18  Laban Emperor, PA-C  dicyclomine (BENTYL) 20 MG tablet Take 1 tablet (20 mg total) by mouth 3 (three) times daily as needed for spasms. 02/10/15 08/18/18  Carrie Mew, MD  metoCLOPramide (REGLAN) 5 MG tablet Take 1 tablet (5 mg total) by mouth every 8 (eight) hours as needed for nausea or vomiting. 11/22/14 08/18/18  Menshew, Dannielle Karvonen, PA-C  promethazine (PHENERGAN) 12.5 MG tablet Take 1 tablet (12.5 mg total) by mouth every 6 (six) hours as needed for nausea or vomiting. 11/04/15 08/18/18  Daymon Larsen, MD  ranitidine (ZANTAC) 150 MG capsule Take 1 capsule (150 mg total) by mouth 2 (two) times  daily. 02/10/15 08/18/18  Carrie Mew, MD    Allergies Tramadol and Penicillins  No family history on file.  Social History Social History   Tobacco Use  . Smoking status: Current Every Day Smoker    Packs/day: 0.50    Types: Cigarettes  . Smokeless tobacco: Never Used  Substance Use Topics  . Alcohol use: No  . Drug use: No    Review of Systems Constitutional: No fever/chills Eyes: No visual changes. ENT: No sore throat. Cardiovascular: Denies chest pain. Respiratory: Denies shortness of breath. Gastrointestinal: Positive for abdominal pain.    Genitourinary: Negative for dysuria. Musculoskeletal: Negative for back pain. Skin: Negative for rash. Neurological: Negative for headaches, focal weakness or numbness.  ____________________________________________   PHYSICAL EXAM:  VITAL SIGNS: ED Triage Vitals  Enc Vitals Group     BP 04/07/19 1402 (!) 164/93     Pulse Rate 04/07/19 1402 88     Resp 04/07/19 1402 18     Temp 04/07/19 1402 98.3 F (36.8 C)     Temp src --      SpO2 04/07/19 1402 100 %     Weight 04/07/19 1353 197 lb (89.4 kg)     Height 04/07/19 1353 '5\' 7"'  (1.702 m)     Head Circumference --      Peak Flow --      Pain Score 04/07/19 1352 10   Constitutional: Alert and oriented.  Eyes: Conjunctivae are normal.  ENT      Head: Normocephalic and atraumatic.      Nose: No congestion/rhinnorhea.      Mouth/Throat: Mucous membranes are moist.      Neck: No stridor. Hematological/Lymphatic/Immunilogical: No cervical lymphadenopathy. Cardiovascular: Normal rate, regular rhythm.  No murmurs, rubs, or gallops.  Respiratory: Normal respiratory effort without tachypnea nor retractions. Breath sounds are clear and equal bilaterally. No wheezes/rales/rhonchi. Gastrointestinal: Soft and tender to palpation in the right lower quadrant. No erythema.  Genitourinary: Deferred Musculoskeletal: Normal range of motion in all extremities. No lower extremity  edema. Neurologic:  Normal speech and language. No gross focal neurologic deficits are appreciated.  Skin:  Skin is warm, dry and intact. No rash noted. Psychiatric: Mood and affect are normal. Speech and behavior are normal. Patient exhibits appropriate insight and judgment.  ____________________________________________    LABS (pertinent positives/negatives)  Acetaminophen level <10 CMP wnl except ast 598, alt 566, alk phos 203 CBC wbc 9.3, hgb 13.0, plt 335 Lipase 46  ____________________________________________   EKG  None  ____________________________________________    RADIOLOGY  CT ab/pel Expected post surgical changes. No abscess. No liver abnormality.  RUQ [place] Normal liver ____________________________________________   PROCEDURES  Procedures  ____________________________________________   INITIAL IMPRESSION / ASSESSMENT AND PLAN / ED COURSE  Pertinent labs & imaging results that were available during my care of the patient were reviewed by me and considered in my medical decision making (see chart for details).  Patient presented to the emergency department today because of concerns for abdominal pain initiated  2 days postop from hysterectomy.  On exam she does have tenderness to the right side of her abdomen.  CT scan showed expected postsurgical changes without any abscess formation.  Blood work was notable for elevation of her AST, ALT and alk phos.  I did obtain a right upper quadrant ultrasound which not show any acute findings.  Discussed with Dr. Wendee Beavers with GI who recommended admission overnight.  Additionally will add on coags and acute hepatitis panel.   ____________________________________________   FINAL CLINICAL IMPRESSION(S) / ED DIAGNOSES  Final diagnoses:  Pain  Transaminitis     Note: This dictation was prepared with Dragon dictation. Any transcriptional errors that result from this process are unintentional     Nance Pear,  MD 04/07/19 1936

## 2019-04-07 NOTE — Progress Notes (Signed)
  Postoperative Pain Patient presents with pain following Laparoscopic Hysterectomy for abnormal uterine bleeding,  2 days ago.  Subjective: Patient reports right sided lower pelvic and back pain since yesterday.  Radiates around the right side and back.  No VB, rare nausea (due to pain).  She reports having urgency to urinate with low volume output when she tries to void.  No hemturia.  No fever.     Objective: BP 120/80   Ht 5\' 7"  (1.702 m)   LMP 03/08/2019 (Approximate)   BMI 31.01 kg/m  Physical Exam Constitutional:      General: She is not in acute distress.    Appearance: She is well-developed.  Genitourinary:     Pelvic exam was performed with patient supine.     Vagina and rectum normal.     No vaginal erythema or bleeding.     No right or left adnexal mass present.     Right adnexa not tender.     Left adnexa not tender.     Genitourinary Comments: Cervix and uterus absent. Vaginal cuff healing well.  Cardiovascular:     Rate and Rhythm: Normal rate.  Pulmonary:     Effort: Pulmonary effort is normal.  Abdominal:     General: There is no distension.     Palpations: Abdomen is soft.     Tenderness: There is no abdominal tenderness.     Comments: Incision healing well.  Musculoskeletal:        General: Normal range of motion.  Neurological:     Mental Status: She is alert and oriented to person, place, and time.     Cranial Nerves: No cranial nerve deficit.  Skin:    General: Skin is warm and dry.      In and out cath: 150 mL clear yellow urine, no blood or debris  Assessment: s/p :  total laparoscopic hysterectomy with bilateral salpingectomy with pain out of proportion to usualy recovery, and urinary urgency without retention.    Plan: Due to pain, and right sided and back concerns, rec ER for eval and CT UOP characteristics and volume reassuring but would prefer to know right kidney and ureter function D/W triage nurse at [facility] ER and they will await her  arrival to proceed w further evaluation  [facility] 04/07/2019, 1:34 PM

## 2019-04-07 NOTE — ED Notes (Signed)
Pt states that she doesn't want to be admitted to the hospital due to her having children at home. MD Derrill Kay made aware of pt situation.

## 2019-04-07 NOTE — ED Notes (Signed)
US at bedside

## 2019-04-08 ENCOUNTER — Telehealth: Payer: Self-pay

## 2019-04-08 LAB — HEPATITIS PANEL, ACUTE
HCV Ab: NONREACTIVE
Hep A IgM: NONREACTIVE
Hep B C IgM: NONREACTIVE
Hepatitis B Surface Ag: NONREACTIVE

## 2019-04-08 LAB — SARS CORONAVIRUS 2 (TAT 6-24 HRS): SARS Coronavirus 2: NEGATIVE

## 2019-04-08 NOTE — Telephone Encounter (Signed)
-----   Message from Nadara Mustard, MD sent at 04/08/2019  7:40 AM EST ----- Regarding: Call pt Check and see how she is doing today with her pain and ability to empty bladder.

## 2019-04-08 NOTE — Telephone Encounter (Signed)
Called pt on cell left voice message to see how she is feeling today.

## 2019-04-08 NOTE — Telephone Encounter (Signed)
Called pt to check on her, no answer and no voice mail box set up.

## 2019-04-11 ENCOUNTER — Telehealth: Payer: Self-pay

## 2019-04-11 NOTE — Telephone Encounter (Signed)
Pt states she's sore when she coughs she has a head cold or sinus infection. Pt aware to call us if she needs [place].

## 2019-04-11 NOTE — Telephone Encounter (Signed)
-----   Message from Nadara Mustard, MD sent at 04/11/2019  3:08 PM EDT ----- Regarding: check Call and see how she is doing s/p hysterectomy last week

## 2019-04-12 ENCOUNTER — Other Ambulatory Visit: Payer: Self-pay | Admitting: Obstetrics & Gynecology

## 2019-04-12 MED ORDER — AZITHROMYCIN 250 MG PO TABS: ORAL_TABLET | ORAL | 1 refills | Status: DC

## 2019-04-12 NOTE — Telephone Encounter (Signed)
Please advise 

## 2019-04-21 ENCOUNTER — Ambulatory Visit: Payer: Medicaid Other | Admitting: Obstetrics & Gynecology

## 2020-04-24 ENCOUNTER — Other Ambulatory Visit: Payer: Self-pay

## 2020-04-24 ENCOUNTER — Emergency Department
Admission: EM | Admit: 2020-04-24 | Discharge: 2020-04-24 | Disposition: A | Discharge status: Home / Self Care | Payer: No Typology Code available for payment source | Attending: Emergency Medicine | Admitting: Emergency Medicine

## 2020-04-24 ENCOUNTER — Emergency Department: Payer: No Typology Code available for payment source

## 2020-04-24 DIAGNOSIS — M7918 Myalgia, other site: Secondary | ICD-10-CM

## 2020-04-24 DIAGNOSIS — F17210 Nicotine dependence, cigarettes, uncomplicated: Secondary | ICD-10-CM | POA: Diagnosis not present

## 2020-04-24 DIAGNOSIS — M79672 Pain in left foot: Secondary | ICD-10-CM | POA: Diagnosis not present

## 2020-04-24 DIAGNOSIS — R072 Precordial pain: Secondary | ICD-10-CM | POA: Diagnosis present

## 2020-04-24 DIAGNOSIS — Y92410 Unspecified street and highway as the place of occurrence of the external cause: Secondary | ICD-10-CM | POA: Diagnosis not present

## 2020-04-24 MED ORDER — CYCLOBENZAPRINE HCL 5 MG PO TABS: 5.0000 mg | ORAL_TABLET | Freq: Three times a day (TID) | ORAL | 0 refills | Status: AC | PRN

## 2020-04-24 NOTE — ED Notes (Signed)
Signature pad not working at this time, pt verbalizes understanding of d/c instructions, denies questions or concerns.  Pt declines d/c vitals
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
## 2020-04-24 NOTE — Discharge Instructions (Signed)
Your exam and XR are normal at this time. Take the prescription muscle relaxant as needed.

## 2021-02-19 ENCOUNTER — Telehealth: Payer: Medicaid Other | Admitting: Physician Assistant

## 2021-02-19 DIAGNOSIS — H66002 Acute suppurative otitis media without spontaneous rupture of ear drum, left ear: Secondary | ICD-10-CM | POA: Diagnosis not present

## 2021-02-19 MED ORDER — SULFAMETHOXAZOLE-TRIMETHOPRIM 800-160 MG PO TABS: 1.0000 | ORAL_TABLET | Freq: Two times a day (BID) | ORAL | 0 refills | Status: DC

## 2021-02-19 MED ORDER — NEOMYCIN-POLYMYXIN-HC 3.5-10000-1 OT SOLN: 3.0000 [drp] | Freq: Four times a day (QID) | OTIC | 0 refills | Status: AC

## 2021-02-19 NOTE — Progress Notes (Signed)
E Visit for Swimmer's Ear  We are sorry that you are not feeling well. Here is how we plan to help!  I have prescribed: Neomycin 0.35%, polymyxin B 10,000 units/mL, and hydrocortisone 0,5% otic solution 4 drops in affected ears four times a day for 7 days  Based on what you have told me you may have a bacterial infection. In addition to the ear drops I have prescribed an oral antibiotic: and Bactrim 800mg /160mg  one tablet by mouth twice a day for 10 days  In certain cases swimmer's ear may progress to a more serious bacterial infection of the middle or inner ear.  If you have a fever 102 and up and significantly worsening symptoms, this could indicate a more serious infection moving to the middle/inner and needs face to face evaluation in an office by a provider.  Your symptoms should improve over the next 3 days and should resolve in about 7 days.  HOME CARE:  Wash your hands frequently. Do not place the tip of the bottle on your ear or touch it with your fingers. You can take Acetominophen 650 mg every 4-6 hours as needed for pain.  If pain is severe or moderate, you can apply a heating pad (set on low) or hot water bottle (wrapped in a towel) to outer ear for 20 minutes.  This will also increase drainage. Avoid ear plugs Do not use Q-tips After showers, help the water run out by tilting your head to one side.  GET HELP RIGHT AWAY IF:  Fever is over 102.2 degrees. You develop progressive ear pain or hearing loss. Ear symptoms persist longer than 3 days after treatment.  MAKE SURE YOU:  Understand these instructions. Will watch your condition. Will get help right away if you are not doing well or get worse.  TO PREVENT SWIMMER'S EAR: Use a bathing cap or custom fitted swim molds to keep your ears dry. Towel off after swimming to dry your ears. Tilt your head or pull your earlobes to allow the water to escape your ear canal. If there is still water in your ears, consider using a  hairdryer on the lowest setting.   Thank you for choosing an e-visit.  Your e-visit answers were reviewed by a board certified advanced clinical practitioner to complete your personal care plan. Depending upon the condition, your plan could have included both over the counter or prescription medications.  Please review your pharmacy choice. Make sure the pharmacy is open so you can pick up prescription now. If there is a problem, you may contact your provider through and have the prescription routed to another pharmacy.  Your safety is important to [employer]. If you have drug allergies check your prescription carefully.   For the next 24 hours you can use MyChart to ask questions about today's visit, request a non-urgent call back, or ask for a work or school excuse. You will get an email in the next two days asking about your experience. I hope that your e-visit has been valuable and will speed your recovery.   I provided 5 minutes of non face-to-face time during this encounter for chart review and documentation.

## 2021-12-21 ENCOUNTER — Telehealth: Payer: Self-pay | Admitting: Physician Assistant

## 2021-12-21 DIAGNOSIS — R600 Localized edema: Secondary | ICD-10-CM
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
## 2021-12-21 NOTE — Patient Instructions (Signed)
Der L Garbutt, thank you for joining Mar Daring, PA-C for today's virtual visit.  While this provider is not your primary care provider (PCP), if your PCP is located in our provider database this encounter information will be shared with them immediately following your visit.   [facility] account gives you access to today's visit and all your visits, tests, and labs performed at [facility] " click here if you don't have a [facility] account or go to [email or web address]  Consent: (Patient) Holly Berry provided verbal consent for this virtual visit at the beginning of the encounter.  Current Medications:  Current Outpatient Medications:    cyclobenzaprine (FLEXERIL) 5 MG tablet, Take 1 tablet (5 mg total) by mouth 3 (three) times daily as needed., Disp: 15 tablet, Rfl: 0   neomycin-polymyxin-hydrocortisone (CORTISPORIN) OTIC solution, Place 3 drops into the left ear 4 (four) times daily., Disp: 10 mL, Rfl: 0   sulfamethoxazole-trimethoprim (BACTRIM DS) 800-160 MG tablet, Take 1 tablet by mouth 2 (two) times daily., Disp: 20 tablet, Rfl: 0   Medications ordered in this encounter:  No orders of the defined types were placed in this encounter.    *If you need refills on other medications prior to your next appointment, please contact your pharmacy*  Follow-Up: Call back or seek an in-person evaluation if the symptoms worsen or if the condition fails to improve as anticipated.  [place] [phone]  Other Instructions  Edema  Edema is an abnormal buildup of fluids in the body tissues and under the skin. Swelling of the legs, feet, and ankles is a common symptom that becomes more likely as you get older. Swelling is also common in looser tissues, such as around the eyes. Pressing on the area may make a temporary dent in your skin (pitting edema). This fluid may also accumulate in your lungs (pulmonary edema). There  are many possible causes of edema. Eating too much salt (sodium) and being on your feet or sitting for a long time can cause edema in your legs, feet, and ankles. Common causes of edema include: Certain medical conditions, such as heart failure, liver or kidney disease, and cancer. Weak leg blood vessels. An injury. Pregnancy. Medicines. Being obese. Low protein levels in the blood. Hot weather may make edema worse. Edema is usually painless. Your skin may look swollen or shiny. Follow these instructions at home: Medicines Take over-the-counter and prescription medicines only as told by your health care provider. Your health care provider may prescribe a medicine to help your body get rid of extra water (diuretic). Take this medicine if you are told to take it. Eating and drinking Eat a low-salt (low-sodium) diet to reduce fluid as told by your health care provider. Sometimes, eating less salt may reduce swelling. Depending on the cause of your swelling, you may need to limit how much fluid you drink (fluid restriction). General instructions Raise (elevate) the injured area above the level of your heart while you are sitting or lying down. Do not sit still or stand for long periods of time. Do not wear tight clothing. Do not wear garters on your upper legs. Exercise your legs to get your circulation going. This helps to move the fluid back into your blood vessels, and it may help the swelling go down. Wear compression stockings as told by your health care provider. These stockings help to prevent blood clots and reduce swelling in your legs. It is important that  these are the correct size. These stockings should be prescribed by your health care provider to prevent possible injuries. If elastic bandages or wraps are recommended, use them as told by your health care provider. Contact a health care provider if: Your edema does not get better with treatment. You have heart, liver, or kidney  disease and have symptoms of edema. You have sudden and unexplained weight gain. Get help right away if: You develop shortness of breath or chest pain. You cannot breathe when you lie down. You develop pain, redness, or warmth in the swollen areas. You have heart, liver, or kidney disease and suddenly get edema. You have a fever and your symptoms suddenly get worse. These symptoms may be an emergency. Get help right away. Call 911. Do not wait to see if the symptoms will go away. Do not drive yourself to the hospital. Summary Edema is an abnormal buildup of fluids in the body tissues and under the skin. Eating too much salt (sodium)and being on your feet or sitting for a long time can cause edema in your legs, feet, and ankles. Raise (elevate) the injured area above the level of your heart while you are sitting or lying down. Follow your health care provider's instructions about diet and how much fluid you can drink. This information is not intended to replace advice given to you by your health care provider. Make sure you discuss any questions you have with your health care provider. Document Revised: 09/17/2020 Document Reviewed: 09/17/2020 Elsevier Patient Education  2023 Elsevier Inc.    If you have been instructed to have an in-person evaluation today at a local Urgent Care facility, please use the link below. It will take you to a list of all of our available [facility], including address, phone number and hours of operation. Please do not delay care.  [facility]  If you or a family member do not have a primary care provider, use the link below to schedule a visit and establish care. When you choose a [facility] primary care physician or advanced practice provider, you gain a long-term partner in health. Find a Primary Care Provider  Learn more about [facility]'s in-office and virtual care options: [facility] - Get Care Now

## 2022-02-20 ENCOUNTER — Emergency Department
Admission: EM | Admit: 2022-02-20 | Discharge: 2022-02-20 | Disposition: A | Discharge status: Home / Self Care | Payer: Medicaid Other | Attending: Student in an Organized Health Care Education/Training Program | Admitting: Student in an Organized Health Care Education/Training Program

## 2022-02-20 ENCOUNTER — Other Ambulatory Visit: Payer: Self-pay

## 2022-02-20 ENCOUNTER — Emergency Department: Payer: Medicaid Other

## 2022-02-20 DIAGNOSIS — W540XXA Bitten by dog, initial encounter: Secondary | ICD-10-CM | POA: Insufficient documentation

## 2022-02-20 DIAGNOSIS — Z23 Encounter for immunization: Secondary | ICD-10-CM | POA: Diagnosis not present

## 2022-02-20 DIAGNOSIS — S41152A Open bite of left upper arm, initial encounter: Secondary | ICD-10-CM | POA: Insufficient documentation

## 2022-02-20 DIAGNOSIS — S51812A Laceration without foreign body of left forearm, initial encounter: Secondary | ICD-10-CM | POA: Insufficient documentation

## 2022-02-20 DIAGNOSIS — L03114 Cellulitis of left upper limb: Secondary | ICD-10-CM | POA: Diagnosis not present

## 2022-02-20 LAB — CBC WITH DIFFERENTIAL/PLATELET
Abs Immature Granulocytes: 0.17 10*3/uL — ABNORMAL HIGH (ref 0.00–0.07)
Basophils Absolute: 0.1 10*3/uL (ref 0.0–0.1)
Basophils Relative: 0 %
Eosinophils Absolute: 0.1 10*3/uL (ref 0.0–0.5)
Eosinophils Relative: 0 %
HCT: 43.7 % (ref 36.0–46.0)
Hemoglobin: 14.3 g/dL (ref 12.0–15.0)
Immature Granulocytes: 1 %
Lymphocytes Relative: 7 %
Lymphs Abs: 1.3 10*3/uL (ref 0.7–4.0)
MCH: 28.7 pg (ref 26.0–34.0)
MCHC: 32.7 g/dL (ref 30.0–36.0)
MCV: 87.8 fL (ref 80.0–100.0)
Monocytes Absolute: 1 10*3/uL (ref 0.1–1.0)
Monocytes Relative: 5 %
Neutro Abs: 17 10*3/uL — ABNORMAL HIGH (ref 1.7–7.7)
Neutrophils Relative %: 87 %
Platelets: 378 10*3/uL (ref 150–400)
RBC: 4.98 MIL/uL (ref 3.87–5.11)
RDW: 13 % (ref 11.5–15.5)
WBC: 19.7 10*3/uL — ABNORMAL HIGH (ref 4.0–10.5)
nRBC: 0 % (ref 0.0–0.2)

## 2022-02-20 LAB — BASIC METABOLIC PANEL
Anion gap: 12 (ref 5–15)
BUN: 10 mg/dL (ref 6–20)
CO2: 27 mmol/L (ref 22–32)
Calcium: 9.2 mg/dL (ref 8.9–10.3)
Chloride: 95 mmol/L — ABNORMAL LOW (ref 98–111)
Creatinine, Ser: 0.79 mg/dL (ref 0.44–1.00)
GFR, Estimated: >60 mL/min (ref >60)
Glucose, Bld: 108 mg/dL — ABNORMAL HIGH (ref 70–99)
Potassium: 2.9 mmol/L — ABNORMAL LOW (ref 3.5–5.1)
Sodium: 134 mmol/L — ABNORMAL LOW (ref 135–145)

## 2022-02-20 MED ORDER — DOXYCYCLINE HYCLATE 100 MG PO TABS: 100.0000 mg | ORAL_TABLET | Freq: Two times a day (BID) | ORAL | 0 refills | Status: AC

## 2022-02-20 MED ORDER — OXYCODONE-ACETAMINOPHEN 5-325 MG PO TABS
1.0000 | ORAL_TABLET | Freq: Once | ORAL | Status: AC
  Administered 2022-02-20: 1 via ORAL
  Filled 2022-02-20: qty 1

## 2022-02-20 MED ORDER — DOXYCYCLINE HYCLATE 100 MG PO TABS: 100.0000 mg | ORAL_TABLET | Freq: Two times a day (BID) | ORAL | 0 refills | Status: DC

## 2022-02-20 MED ORDER — OXYCODONE-ACETAMINOPHEN 5-325 MG PO TABS: 1.0000 | ORAL_TABLET | ORAL | 0 refills | Status: AC | PRN

## 2022-02-20 MED ORDER — DOXYCYCLINE HYCLATE 100 MG PO TABS
100.0000 mg | ORAL_TABLET | Freq: Once | ORAL | Status: AC
  Administered 2022-02-20: 100 mg via ORAL
  Filled 2022-02-20: qty 1

## 2022-02-20 MED ORDER — BACITRACIN ZINC 500 UNIT/GM EX OINT
TOPICAL_OINTMENT | Freq: Once | CUTANEOUS | Status: AC
  Filled 2022-02-20: qty 1.8

## 2022-02-20 MED ORDER — TETANUS-DIPHTH-ACELL PERTUSSIS 5-2.5-18.5 LF-MCG/0.5 IM SUSY
0.5000 mL | PREFILLED_SYRINGE | Freq: Once | INTRAMUSCULAR | Status: AC
  Administered 2022-02-20: 0.5 mL via INTRAMUSCULAR
  Filled 2022-02-20: qty 0.5

## 2022-02-20 NOTE — ED Provider Triage Note (Signed)
Emergency Medicine Provider Triage Evaluation Note  Holly Berry , a 37 y.o. female  was evaluated in triage.  Pt complains of left arm pain after dog bite 2 days ago.   Physical Exam  LMP 03/08/2019 (Approximate)  Gen:   Awake, no distress   Resp:  Normal effort  MSK:   Moves extremities without difficulty  Other:    Medical Decision Making  Medically screening exam initiated at 1:41 PM.  Appropriate orders placed.  Holly Berry was informed that the remainder of the evaluation will be completed by another provider, this initial triage assessment does not replace that evaluation, and the importance of remaining in the ED until their evaluation is complete.   Victorino Dike, FNP 02/20/22 [phone]

## 2022-02-20 NOTE — ED Notes (Signed)
Pt reports to this RN that it was the neighbor's dog, and the neighbor informed her that the dog was utd on rabies.

## 2022-02-20 NOTE — ED Triage Notes (Signed)
Pt was bit 2 days ago by dog with 2 puncture wounds on her left arm.

## 2022-02-20 NOTE — ED Notes (Signed)
Animal control contacted.  

## 2022-02-20 NOTE — ED Notes (Signed)
Pt was bitten by dog 2 days ago, pt states she did not call police and does not know if dog is immunized. Pt with left arm swollen and red.

## 2022-02-20 NOTE — ED Provider Notes (Signed)
[facility] Provider Note    Event Date/Time   First MD Initiated Contact with Patient 02/20/22 1515     (approximate)   History   Animal Bite   HPI  Cambree L Padmore is a 37 y.o. female previously healthy presents to the ER for evaluation of left forearm wound that occurred after she was bit by dog 2 days ago.  Dog got into a fight with her dog.  She is trying to break up the fight as the dog also started attacking her son.  The dog latched onto her left forearm.  Started having worsening pain over the past 24 hours associated with some redness.  No fevers or chills.  Does have an anaphylactic allergy to penicillin as well as Toradol.     Physical Exam   Triage Vital Signs: ED Triage Vitals  Enc Vitals Group     BP 02/20/22 1341 (!) 127/99     Pulse Rate 02/20/22 1341 (!) 101     Resp 02/20/22 1341 20     Temp 02/20/22 1341 98.3 F (36.8 C)     Temp Source 02/20/22 1341 Oral     SpO2 02/20/22 1341 97 %     Weight 02/20/22 1342 200 lb (90.7 kg)     Height 02/20/22 1342 5\' 7"  (1.702 m)     Head Circumference --      Peak Flow --      Pain Score 02/20/22 1341 9     Pain Loc --      Pain Edu? --      Excl. in [place]? --     Most recent vital signs: Vitals:   02/20/22 1341  BP: (!) 127/99  Pulse: (!) 101  Resp: 20  Temp: 98.3 F (36.8 C)  SpO2: 97%     Constitutional: Alert  Eyes: Conjunctivae are normal.  Head: Atraumatic. Nose: No congestion/rhinnorhea. Mouth/Throat: Mucous membranes are moist.   Neck: Painless ROM.  Cardiovascular:   Good peripheral circulation. Respiratory: Normal respiratory effort.  No retractions.  Gastrointestinal: Soft and nontender.  Musculoskeletal:  no deformity.  1.5 cm linear laceration and puncture wound to the volar left forearm.  5 cm surrounding cellulitic change.  No underlying fluctuance no purulence.  Compartment is soft.  There is a 2 cm linear laceration to the dorsal aspect of the left forearm.   She is neurovascularly intact distally.  No crepitus. Neurologic:  MAE spontaneously. No gross focal neurologic deficits are appreciated.  Skin:  Skin is warm, dry and intact. No rash noted. Psychiatric: Mood and affect are normal. Speech and behavior are normal.    ED Results / Procedures / Treatments   Labs (all labs ordered are listed, but only abnormal results are displayed) Labs Reviewed  CBC WITH DIFFERENTIAL/PLATELET - Abnormal; Notable for the following components:      Result Value   WBC 19.7 (*)    Neutro Abs 17.0 (*)    Abs Immature Granulocytes 0.17 (*)    All other components within normal limits  BASIC METABOLIC PANEL - Abnormal; Notable for the following components:   Sodium 134 (*)    Potassium 2.9 (*)    Chloride 95 (*)    Glucose, Bld 108 (*)    All other components within normal limits     EKG     RADIOLOGY Please see ED Course for my review and interpretation.  I personally reviewed all radiographic images ordered to evaluate for the above  acute complaints and reviewed radiology reports and findings.  These findings were personally discussed with the patient.  Please see medical record for radiology report.    PROCEDURES:  Critical Care performed: No  Procedures   MEDICATIONS ORDERED IN ED: Medications  bacitracin ointment (has no administration in time range)  Tdap (BOOSTRIX) injection 0.5 mL (0.5 mLs Intramuscular Given 02/20/22 1448)  oxyCODONE-acetaminophen (PERCOCET/ROXICET) 5-325 MG per tablet 1 tablet (1 tablet Oral Given 02/20/22 1558)  doxycycline (VIBRA-TABS) tablet 100 mg (100 mg Oral Given 02/20/22 1558)     IMPRESSION / MDM / ASSESSMENT AND PLAN / ED COURSE  I reviewed the triage vital signs and the nursing notes.                              Differential diagnosis includes, but is not limited to, abscess, cellulitis, foreign body, fracture, NSTI  Patient presenting to the ER for evaluation of symptoms as described above.   Based on symptoms, risk factors and considered above differential, this presenting complaint could reflect a potentially life-threatening illness therefore the patient will be placed on continuous pulse oximetry and telemetry for monitoring.  Laboratory evaluation will be sent to evaluate for the above complaints.  Patient with exam findings consistent with cellulitis secondary to dog bite wound.  LR IN EC score 1.  Is not consistent with NSTI clinically.  Will be placed on doxycycline given her penicillin allergy.  Will be given pain medication.  Given topical wound care.  Would not reapproximate wounds due to duration.  Discussed supportive care.  Animal control has been called.  Per report dog is vaccinated.  Her tetanus was updated.       FINAL CLINICAL IMPRESSION(S) / ED DIAGNOSES   Final diagnoses:  Dog bite of left arm, initial encounter     Rx / DC Orders   ED Discharge Orders          Ordered    oxyCODONE-acetaminophen (PERCOCET) 5-325 MG tablet  Every 4 hours PRN        02/20/22 1558    doxycycline (VIBRA-TABS) 100 MG tablet  2 times daily        02/20/22 1558             Note:  This document was prepared using Dragon voice recognition software and may include unintentional dictation errors.    Merlyn Lot, MD 02/20/22 [phone]

## 2022-02-24 ENCOUNTER — Telehealth: Payer: Medicaid Other | Admitting: Family Medicine

## 2022-02-24 DIAGNOSIS — S41152S Open bite of left upper arm, sequela: Secondary | ICD-10-CM

## 2022-02-24 NOTE — Patient Instructions (Signed)
  Anderia L Zima, thank you for joining Perlie Mayo, NP for today's virtual visit.  While this provider is not your primary care provider (PCP), if your PCP is located in our provider database this encounter information will be shared with them immediately following your visit.   [facility] account gives you access to today's visit and all your visits, tests, and labs performed at [facility] " click here if you don't have a [facility] account or go to [email or web address]  Consent: (Patient) Shantika L Lachney provided verbal consent for this virtual visit at the beginning of the encounter.  Current Medications:  Current Outpatient Medications:    cyclobenzaprine (FLEXERIL) 5 MG tablet, Take 1 tablet (5 mg total) by mouth 3 (three) times daily as needed., Disp: 15 tablet, Rfl: 0   doxycycline (VIBRA-TABS) 100 MG tablet, Take 1 tablet (100 mg total) by mouth 2 (two) times daily for 14 days., Disp: 28 tablet, Rfl: 0   neomycin-polymyxin-hydrocortisone (CORTISPORIN) OTIC solution, Place 3 drops into the left ear 4 (four) times daily., Disp: 10 mL, Rfl: 0   oxyCODONE-acetaminophen (PERCOCET) 5-325 MG tablet, Take 1 tablet by mouth every 4 (four) hours as needed for severe pain., Disp: 10 tablet, Rfl: 0   Medications ordered in this encounter:  No orders of the defined types were placed in this encounter.    *If you need refills on other medications prior to your next appointment, please contact your pharmacy*  Follow-Up: Call back or seek an in-person evaluation if the symptoms worsen or if the condition fails to improve as anticipated.  [facility] [phone]  Other Instructions  GO back to ED for in person hands on assessment   If you have been instructed to have an in-person evaluation today at a local Urgent Care facility, please use the link below. It will take you to a list of all of our available [facility],  including address, phone number and hours of operation. Please do not delay care.  [facility]  If you or a family member do not have a primary care provider, use the link below to schedule a visit and establish care. When you choose a [employer] primary care physician or advanced practice provider, you gain a long-term partner in health. Find a Primary Care Provider  Learn more about [facility]'s in-office and virtual care options: [facility] Now

## 2022-02-24 NOTE — Progress Notes (Signed)
Virtual Visit Consent   Holly Berry, you are scheduled for a virtual visit with a [facility] provider today. Just as with appointments in the office, your consent must be obtained to participate. Your consent will be active for this visit and any virtual visit you may have with one of our providers in the next 365 days. If you have a MyChart account, a copy of this consent can be sent to you electronically.  As this is a virtual visit, video technology does not allow for your provider to perform a traditional examination. This may limit your provider's ability to fully assess your condition. If your provider identifies any concerns that need to be evaluated in person or the need to arrange testing (such as labs, EKG, etc.), we will make arrangements to do so. Although advances in technology are sophisticated, we cannot ensure that it will always work on either your end or our end. If the connection with a video visit is poor, the visit may have to be switched to a telephone visit. With either a video or telephone visit, we are not always able to ensure that we have a secure connection.  By engaging in this virtual visit, you consent to the provision of healthcare and authorize for your insurance to be billed (if applicable) for the services provided during this visit. Depending on your insurance coverage, you may receive a charge related to this service.  I need to obtain your verbal consent now. Are you willing to proceed with your visit today? Holly Berry has provided verbal consent on 02/24/2022 for a virtual visit (video or telephone). Perlie Mayo, NP  Date: 02/24/2022 12:11 PM  Virtual Visit via Video Note   I, Perlie Mayo, connected with  Holly Berry  ([id number], 06/17/1985) on 02/24/22 at 12:15 PM EST by a video-enabled telemedicine application and verified that I am speaking with the correct person using two identifiers.  Location: Patient: Virtual Visit Location  Patient: Home Provider: Virtual Visit Location Provider: Home Office   I discussed the limitations of evaluation and management by telemedicine and the availability of in person appointments. The patient expressed understanding and agreed to proceed.    History of Present Illness: Holly Berry is a 37 y.o. who identifies as a female who was assigned female at birth, and is being seen today for dog bite reevaluation. Left wrist is hard to move and bend without pain. Reports swelling and pain into hand distal from site of bite. Has been taking Doxy and keeping area clean. Drainage is bloody and pus like. Pain is increasing with swelling sensation. Denies fevers, chills, loss of sensation, color or movement, n/v.  Problems:  Patient Active Problem List   Diagnosis Date Noted   Pelvic pain 11/18/2018   Menometrorrhagia 11/15/2018   Dysmenorrhea 11/15/2018   Endometriosis 11/15/2018   Postoperative state 09/17/2012   History of cesarean section 09/17/2012    Allergies:  Allergies  Allergen Reactions   Tramadol Anaphylaxis   Penicillins Hives    Did it involve swelling of the face/tongue/throat, SOB, or low BP? No Did it involve sudden or severe rash/hives, skin peeling, or any reaction on the inside of your mouth or nose? Yes Did you need to seek medical attention at a hospital or doctor's office? Yes When did it last happen?      childhood allergy If all above answers are "NO", may proceed with cephalosporin use.    Medications:  Current Outpatient Medications:  cyclobenzaprine (FLEXERIL) 5 MG tablet, Take 1 tablet (5 mg total) by mouth 3 (three) times daily as needed., Disp: 15 tablet, Rfl: 0   doxycycline (VIBRA-TABS) 100 MG tablet, Take 1 tablet (100 mg total) by mouth 2 (two) times daily for 14 days., Disp: 28 tablet, Rfl: 0   neomycin-polymyxin-hydrocortisone (CORTISPORIN) OTIC solution, Place 3 drops into the left ear 4 (four) times daily., Disp: 10 mL, Rfl: 0    oxyCODONE-acetaminophen (PERCOCET) 5-325 MG tablet, Take 1 tablet by mouth every 4 (four) hours as needed for severe pain., Disp: 10 tablet, Rfl: 0  Observations/Objective: Patient is well-developed, well-nourished in no acute distress.  Resting comfortably  at home.  Head is normocephalic, atraumatic.  No labored breathing.  Speech is clear and coherent with logical content.  Patient is alert and oriented at baseline.  Left arm redness, drainage- blood and pus from puncture wound noted- inability to fully assess virtually.   Assessment and Plan:  1. Dog bite of left arm, sequela  Advised to go back to be seen in person given the worsening symptoms  Concern for pus pocket, deep infection that might need I&D  Patient acknowledged agreement and understanding of the plan.  Past Medical, Surgical, Social History, Allergies, and Medications have been Reviewed.   Follow Up Instructions: I discussed the assessment and treatment plan with the patient. The patient was provided an opportunity to ask questions and all were answered. The patient agreed with the plan and demonstrated an understanding of the instructions.  A copy of instructions were sent to the patient via MyChart unless otherwise noted below.     The patient was advised to call back or seek an in-person evaluation if the symptoms worsen or if the condition fails to improve as anticipated.  Time:  I spent 2 minutes with the patient via telehealth technology discussing the above problems/concerns.    Perlie Mayo, NP

## 2022-02-25 ENCOUNTER — Emergency Department
Admission: EM | Admit: 2022-02-25 | Discharge: 2022-02-25 | Disposition: A | Discharge status: Home / Self Care | Payer: Medicaid Other | Attending: Emergency Medicine | Admitting: Emergency Medicine

## 2022-02-25 DIAGNOSIS — S51852D Open bite of left forearm, subsequent encounter: Secondary | ICD-10-CM | POA: Diagnosis not present

## 2022-02-25 DIAGNOSIS — Z5189 Encounter for other specified aftercare: Secondary | ICD-10-CM

## 2022-02-25 DIAGNOSIS — W540XXD Bitten by dog, subsequent encounter: Secondary | ICD-10-CM | POA: Diagnosis not present

## 2022-02-25 NOTE — ED Notes (Signed)
Discharge instructions explained to patient and family at this time. Patient and family state they understand and agree.   

## 2022-02-25 NOTE — ED Notes (Signed)
Left arm wrapped per patient request

## 2022-02-25 NOTE — ED Provider Notes (Signed)
[facility] Provider Note    Event Date/Time   First MD Initiated Contact with Patient 02/25/22 2015     (approximate)   History   Chief Complaint Animal Bite   HPI  Holly Berry is a 37 y.o. female with past medical history of endometriosis who presents to the ED complaining of animal bite.  Patient reports that she was initially bitten by a dog 8 days ago, seen in the ED 2 days later for evaluation.  Her tetanus was updated at that time and animal was determined to be vaccinated.  She was prescribed doxycycline due to penicillin allergy, states she has been taking this as prescribed.  She is concerned because she continues to have pain to her left forearm at the site of the 2 bites with some swelling.  She is also concerned about redness around the bite sites, has had small amount of drainage from one of the sites.  Redness has not spread further along her arm and she denies any fevers.     Physical Exam   Triage Vital Signs: ED Triage Vitals  Enc Vitals Group     BP 02/25/22 1947 (!) 146/66     Pulse Rate 02/25/22 1947 85     Resp 02/25/22 1947 18     Temp 02/25/22 1947 98.5 F (36.9 C)     Temp Source 02/25/22 1947 Oral     SpO2 02/25/22 1947 99 %     Weight 02/25/22 1948 200 lb (90.7 kg)     Height 02/25/22 1948 5\' 7"  (1.702 m)     Head Circumference --      Peak Flow --      Pain Score 02/25/22 1948 5     Pain Loc --      Pain Edu? --      Excl. in [place]? --     Most recent vital signs: Vitals:   02/25/22 1947  BP: (!) 146/66  Pulse: 85  Resp: 18  Temp: 98.5 F (36.9 C)  SpO2: 99%    Constitutional: Alert and oriented. Eyes: Conjunctivae are normal. Head: Atraumatic. Nose: No congestion/rhinnorhea. Mouth/Throat: Mucous membranes are moist.  Cardiovascular: Normal rate, regular rhythm. Grossly normal heart sounds.  2+ radial pulses bilaterally. Respiratory: Normal respiratory effort.  No retractions. Lungs  CTAB. Gastrointestinal: Soft and nontender. No distention. Musculoskeletal: No lower extremity tenderness nor edema.  Left upper extremity with 2 healing bites, mild tenderness noted with no erythema, warmth, or purulent drainage. Neurologic:  Normal speech and language. No gross focal neurologic deficits are appreciated.         ED Results / Procedures / Treatments   Labs (all labs ordered are listed, but only abnormal results are displayed) Labs Reviewed - No data to display   PROCEDURES:  Critical Care performed: No  Procedures   MEDICATIONS ORDERED IN ED: Medications - No data to display   IMPRESSION / MDM / [place] / ED COURSE  I reviewed the triage vital signs and the nursing notes.                              37 y.o. female with past medical history of endometriosis who presents to the ED complaining of ongoing left forearm pain after being seen in the ED 6 days ago for dog bite.  Patient's presentation is most consistent with acute, uncomplicated illness.  Differential diagnosis includes, but  is not limited to, abscess, cellulitis, lymphangitis.  Patient nontoxic-appearing and in no acute distress, vital signs are unremarkable.  Wounds appear to be well-healing with no signs of cellulitis or abscess, granulation tissue noted but no purulent drainage.  Given appropriate healing, would not make any changes to her antibiotic regimen at this time and she is appropriate for outpatient follow-up.  She was counseled to return to the ED for new or worsening symptoms, patient agrees with plan.      FINAL CLINICAL IMPRESSION(S) / ED DIAGNOSES   Final diagnoses:  Visit for wound check  Dog bite, subsequent encounter     Rx / DC Orders   ED Discharge Orders     None        Note:  This document was prepared using Dragon voice recognition software and may include unintentional dictation errors.   Blake Divine, MD 02/25/22 2052

## 2022-02-25 NOTE — ED Triage Notes (Signed)
Ambulatory to triage with c/o dog bite to left arm. Incident happened on Monday. Seen here and given antibiotics. Reports she isnt able to fully outstretch hand due to wound and it appears red and feels hard.

## 2022-05-28 NOTE — Telephone Encounter (Signed)
Error
